# Patient Record
Sex: Male | Born: 1995 | Race: Black or African American | Hispanic: No | Marital: Single | Smoking: Current every day smoker
Health system: Southern US, Community
[De-identification: ages and names within clinical notes are randomized; demographics above are authoritative.]

## PROBLEM LIST (undated history)

## (undated) DIAGNOSIS — A539 Syphilis, unspecified: Secondary | ICD-10-CM

## (undated) DIAGNOSIS — A549 Gonococcal infection, unspecified: Secondary | ICD-10-CM

## (undated) HISTORY — PX: NECK SURGERY: SHX720

---
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
## 2013-01-11 ENCOUNTER — Encounter: Payer: Self-pay | Admitting: Emergency Medicine

## 2013-01-11 ENCOUNTER — Ambulatory Visit (INDEPENDENT_AMBULATORY_CARE_PROVIDER_SITE_OTHER): Payer: Medicaid Other | Admitting: Emergency Medicine

## 2013-01-11 ENCOUNTER — Other Ambulatory Visit (HOSPITAL_COMMUNITY)
Admission: RE | Admit: 2013-01-11 | Discharge: 2013-01-11 | Disposition: A | Discharge status: Home / Self Care | Payer: Medicaid Other | Source: Ambulatory Visit | Attending: Family Medicine | Admitting: Family Medicine

## 2013-01-11 VITALS — BP 130/79 | HR 70 | Ht 65.0 in | Wt 126.0 lb

## 2013-01-11 DIAGNOSIS — Z00129 Encounter for routine child health examination without abnormal findings: Secondary | ICD-10-CM

## 2013-01-11 DIAGNOSIS — Z113 Encounter for screening for infections with a predominantly sexual mode of transmission: Secondary | ICD-10-CM

## 2013-01-11 DIAGNOSIS — Z23 Encounter for immunization: Secondary | ICD-10-CM

## 2013-01-11 NOTE — Patient Instructions (Addendum)
It was nice to meet you! It sounds like everything is going well. We are going to start the HPV vaccine today. You will need to come back in April and August to complete the series. I recommend that you get tested for gonorrhea and chlamydia once a year.  You can to this here or at the health department. Follow up in 1 year or sooner as needed.

## 2013-01-11 NOTE — Progress Notes (Signed)
  Subjective:     History was provided by the patient and girlfriend.  Jeremiah Christian is a 17 y.o. male who is here for this wellness visit.   Current Issues: Current concerns include:None  H (Home) Family Relationships: good and lives with grandparents Communication: good with grandparents Responsibilities: has responsibilities at home  E (Education): Grades: okay School: good attendance Future Plans: community college; wants to be Surveyor, minerals  A (Activities) Sports: no sports Exercise: No Activities: > 2 hrs TV/computer Friends: Yes  and activities mostly consist of smoking and drinking with friends  Drugs Tobacco: Yes - anywhere from 1 cigarette to 1/2pack per day Alcohol: Yes - drinks our of liquor bottle 2x per week Drugs: Yes - marijuana  Sex Activity: sexually active and no condom use  Suicide Risk Emotions: healthy Depression: denies feelings of depression Suicidal: denies suicidal ideation     Objective:     Filed Vitals:   01/11/13 1517  BP: 130/79  Pulse: 70  Height: 5\' 5"  (1.651 m)  Weight: 126 lb (57.153 kg)   Growth parameters are noted and are appropriate for age.  General:   alert, cooperative, appears stated age and no distress  Gait:   normal  Skin:   normal  Oral cavity:   lips, mucosa, and tongue normal; teeth and gums normal  Eyes:   sclerae white, pupils equal and reactive, red reflex normal bilaterally  Ears:   normal bilaterally  Neck:   normal, supple  Lungs:  clear to auscultation bilaterally  Heart:   regular rate and rhythm, S1, S2 normal, no murmur, click, rub or gallop  Abdomen:  soft, non-tender; bowel sounds normal; no masses,  no organomegaly  GU:  not examined  Extremities:   extremities normal, atraumatic, no cyanosis or edema  Neuro:  normal without focal findings, mental status, speech normal, alert and oriented x3, PERLA, muscle tone and strength normal and symmetric, reflexes normal and symmetric and gait  and station normal     Assessment:    Healthy 17 y.o. male child.    Plan:   1. Anticipatory guidance discussed. Physical activity, Safety and extensive discussion regarding drug/alcohol use and sexual activity -GC/Chlamydia today for routine screening -start HPV series today  2. Follow-up visit in 12 months for next wellness visit, or sooner as needed.

## 2013-01-15 ENCOUNTER — Encounter: Payer: Self-pay | Admitting: Emergency Medicine

## 2013-08-25 ENCOUNTER — Encounter (HOSPITAL_COMMUNITY): Payer: Self-pay | Admitting: Emergency Medicine

## 2013-08-25 ENCOUNTER — Emergency Department (HOSPITAL_COMMUNITY): Payer: Medicaid Other

## 2013-08-25 ENCOUNTER — Emergency Department (HOSPITAL_COMMUNITY)
Admission: EM | Admit: 2013-08-25 | Discharge: 2013-08-25 | Disposition: A | Discharge status: Home / Self Care | Payer: Medicaid Other | Attending: Emergency Medicine | Admitting: Emergency Medicine

## 2013-08-25 DIAGNOSIS — Y929 Unspecified place or not applicable: Secondary | ICD-10-CM | POA: Insufficient documentation

## 2013-08-25 DIAGNOSIS — S5012XA Contusion of left forearm, initial encounter: Secondary | ICD-10-CM

## 2013-08-25 DIAGNOSIS — W108XXA Fall (on) (from) other stairs and steps, initial encounter: Secondary | ICD-10-CM | POA: Insufficient documentation

## 2013-08-25 DIAGNOSIS — S5010XA Contusion of unspecified forearm, initial encounter: Secondary | ICD-10-CM | POA: Insufficient documentation

## 2013-08-25 DIAGNOSIS — Y9389 Activity, other specified: Secondary | ICD-10-CM | POA: Insufficient documentation

## 2013-08-25 DIAGNOSIS — F17200 Nicotine dependence, unspecified, uncomplicated: Secondary | ICD-10-CM | POA: Insufficient documentation

## 2013-08-25 MED ORDER — IBUPROFEN 600 MG PO TABS: 600.0000 mg | ORAL_TABLET | Freq: Four times a day (QID) | ORAL | Status: DC | PRN

## 2013-08-25 MED ORDER — IBUPROFEN 400 MG PO TABS
600.0000 mg | ORAL_TABLET | Freq: Once | ORAL | Status: AC
  Administered 2013-08-25: 600 mg via ORAL
  Filled 2013-08-25 (×2): qty 1

## 2013-08-25 NOTE — ED Notes (Signed)
Pt reports slip and fall down 3 steps Thursday landing on left arm. Pt has not taken any medication for pain today. Pt able to move extremity.

## 2013-08-25 NOTE — ED Provider Notes (Signed)
CSN: [id number]     Arrival date & time 08/25/13  1745 History  This chart was scribed for non-physician practitioner, Rhea Bleacher, PA-C working with Flint Melter, MD by Greggory Stallion, ED scribe. This patient was seen in room TR09C/TR09C and the patient's care was started at 6:43 PM.   Chief Complaint  Patient presents with  . Fall  . Arm Pain    Left arm   The history is provided by the patient. No language interpreter was used.   HPI Comments: Jeremiah Christian is a 17 y.o. male who presents to the Emergency Department complaining of a fall that occurred 2 days ago. He states he fell down 3 steps and landed on his left arm. Pt has sudden onset left arm pain. Certain movements worsen the pain. Pt denies shoulder pain and elbow pain. He has not taken any medications for the pain.   Past Medical History  Diagnosis Date  . Preterm infant     born 2 months early   Past Surgical History  Procedure Laterality Date  . Neck surgery     Family History  Problem Relation Age of Onset  . Kidney failure Mother     ?genetic   History  Substance Use Topics  . Smoking status: Current Every Day Smoker -- 0.20 packs/day    Types: Cigarettes  . Smokeless tobacco: Never Used  . Alcohol Use: No     Comment: drinks liqour from bottle 2x per week "denies ETOH use"    Review of Systems  Constitutional: Negative for activity change.  Musculoskeletal: Positive for myalgias. Negative for arthralgias, back pain, gait problem, joint swelling and neck pain.  Skin: Negative for wound.  Neurological: Negative for weakness and numbness.   Allergies  Banana  Home Medications  No current outpatient prescriptions on file.  BP 139/72  Pulse 76  Temp(Src) 98.3 F (36.8 C) (Oral)  Resp 18  Wt 130 lb 14.4 oz (59.376 kg)  BMI 21.78 kg/m2  SpO2 98%  Physical Exam  Nursing note and vitals reviewed. Constitutional: He appears well-developed and well-nourished.  HENT:  Head: Normocephalic and  atraumatic.  Eyes: Conjunctivae are normal.  Neck: Normal range of motion. Neck supple.  Cardiovascular: Normal pulses.   Musculoskeletal: He exhibits tenderness. He exhibits no edema.       Left elbow: Normal. He exhibits normal range of motion, no swelling and no effusion. No tenderness found.       Left wrist: Normal. He exhibits normal range of motion, no tenderness and no bony tenderness.       Left forearm: He exhibits tenderness and bony tenderness. He exhibits no swelling and no edema.       Arms:      Left hand: Normal. Normal sensation noted. Normal strength noted.  Neurological: He is alert. No sensory deficit.  Motor, sensation, and vascular distal to the injury is fully intact.   Skin: Skin is warm and dry.  Psychiatric: He has a normal mood and affect.    ED Course  Procedures (including critical care time)  DIAGNOSTIC STUDIES: Oxygen Saturation is 98% on RA, normal by my interpretation.    COORDINATION OF CARE: 6:46 PM-Discussed treatment plan which includes an arm sling and pain medication with pt at bedside and pt agreed to plan. Advised pt to follow up with orthopedics if pain does not resolve in about one week.   Labs Review Labs Reviewed - No data to display Imaging Review Dg Forearm  Left  08/25/2013   CLINICAL DATA:  Fall with left forearm injury and pain.  EXAM: LEFT FOREARM - 2 VIEW  COMPARISON:  None.  FINDINGS: No evidence of acute fracture,subluxation or dislocation identified.  No radio-opaque foreign bodies are present.  No focal bony lesions are noted.  The joint spaces are unremarkable.  IMPRESSION: Unremarkable left forearm.   Electronically Signed   By: Laveda Abbe M.D.   On: 08/25/2013 18:36    EKG Interpretation   None      Vital signs reviewed and are as follows: Filed Vitals:   08/25/13 1849  BP: 118/61  Pulse: 61  Temp:   Resp: 18   Patient informed of x-ray results. Sling by nurse tech. Patient was counseled on RICE protocol and told to  rest injury, use ice for no longer than 15 minutes every hour, compress the area, and elevate above the level of their heart as much as possible to reduce swelling.  Questions answered.  Patient verbalized understanding.    Orthopedic followup given if not improved in one week.  Patient counseled on NSAIDs.   MDM   1. Forearm contusion, left, initial encounter    Forearm injury, x-rays negative. Patient with full range of motion in all joints with some pain. Do not suspect neurovascular compromise. Compartments a forearm are soft.  Conservative management indicated with orthopedic followup if not improving.   I personally performed the services described in this documentation, which was scribed in my presence. The recorded information has been reviewed and is accurate.   Renne Crigler, PA-C 08/25/13 [phone]

## 2013-08-26 NOTE — ED Provider Notes (Signed)
Medical screening examination/treatment/procedure(s) were performed by non-physician practitioner and as supervising physician I was immediately available for consultation/collaboration.  Ralynn San L Jaliyah Fotheringham, MD 08/26/13 0036 

## 2013-09-27 ENCOUNTER — Encounter (HOSPITAL_COMMUNITY): Payer: Self-pay | Admitting: Emergency Medicine

## 2013-09-27 ENCOUNTER — Emergency Department (HOSPITAL_COMMUNITY)
Admission: EM | Admit: 2013-09-27 | Discharge: 2013-09-27 | Disposition: A | Discharge status: Home / Self Care | Payer: Medicaid Other | Attending: Emergency Medicine | Admitting: Emergency Medicine

## 2013-09-27 ENCOUNTER — Emergency Department (HOSPITAL_COMMUNITY)
Admission: EM | Admit: 2013-09-27 | Discharge: 2013-09-27 | Disposition: A | Discharge status: Home / Self Care | Payer: Medicaid Other | Source: Home / Self Care | Attending: Emergency Medicine | Admitting: Emergency Medicine

## 2013-09-27 DIAGNOSIS — F17200 Nicotine dependence, unspecified, uncomplicated: Secondary | ICD-10-CM | POA: Insufficient documentation

## 2013-09-27 DIAGNOSIS — M25539 Pain in unspecified wrist: Secondary | ICD-10-CM | POA: Insufficient documentation

## 2013-09-27 DIAGNOSIS — Y929 Unspecified place or not applicable: Secondary | ICD-10-CM | POA: Insufficient documentation

## 2013-09-27 DIAGNOSIS — M79602 Pain in left arm: Secondary | ICD-10-CM

## 2013-09-27 DIAGNOSIS — Y939 Activity, unspecified: Secondary | ICD-10-CM | POA: Insufficient documentation

## 2013-09-27 DIAGNOSIS — S41112A Laceration without foreign body of left upper arm, initial encounter: Secondary | ICD-10-CM

## 2013-09-27 DIAGNOSIS — R209 Unspecified disturbances of skin sensation: Secondary | ICD-10-CM | POA: Insufficient documentation

## 2013-09-27 DIAGNOSIS — Z792 Long term (current) use of antibiotics: Secondary | ICD-10-CM | POA: Insufficient documentation

## 2013-09-27 DIAGNOSIS — S51809A Unspecified open wound of unspecified forearm, initial encounter: Secondary | ICD-10-CM | POA: Insufficient documentation

## 2013-09-27 DIAGNOSIS — W260XXA Contact with knife, initial encounter: Secondary | ICD-10-CM | POA: Insufficient documentation

## 2013-09-27 DIAGNOSIS — G8911 Acute pain due to trauma: Secondary | ICD-10-CM | POA: Insufficient documentation

## 2013-09-27 DIAGNOSIS — S41112D Laceration without foreign body of left upper arm, subsequent encounter: Secondary | ICD-10-CM

## 2013-09-27 DIAGNOSIS — Y999 Unspecified external cause status: Secondary | ICD-10-CM | POA: Insufficient documentation

## 2013-09-27 MED ORDER — OXYCODONE-ACETAMINOPHEN 5-325 MG PO TABS
2.0000 | ORAL_TABLET | Freq: Once | ORAL | Status: AC
  Administered 2013-09-27: 2 via ORAL
  Filled 2013-09-27: qty 2

## 2013-09-27 MED ORDER — CEPHALEXIN 500 MG PO CAPS: 500.0000 mg | ORAL_CAPSULE | Freq: Three times a day (TID) | ORAL | Status: DC

## 2013-09-27 MED ORDER — IBUPROFEN 800 MG PO TABS
800.0000 mg | ORAL_TABLET | Freq: Once | ORAL | Status: AC
  Administered 2013-09-27: 800 mg via ORAL
  Filled 2013-09-27: qty 1

## 2013-09-27 MED ORDER — STERILE WATER FOR INJECTION IJ SOLN
INTRAMUSCULAR | Status: AC
  Administered 2013-09-27: 2.5 mL
  Filled 2013-09-27: qty 10

## 2013-09-27 MED ORDER — TETANUS-DIPHTH-ACELL PERTUSSIS 5-2.5-18.5 LF-MCG/0.5 IM SUSP
0.5000 mL | Freq: Once | INTRAMUSCULAR | Status: AC
  Administered 2013-09-27: 0.5 mL via INTRAMUSCULAR
  Filled 2013-09-27: qty 0.5

## 2013-09-27 MED ORDER — CEFAZOLIN SODIUM 1 G IJ SOLR
1.0000 g | Freq: Once | INTRAMUSCULAR | Status: AC
  Administered 2013-09-27: 1 g via INTRAMUSCULAR
  Filled 2013-09-27: qty 10

## 2013-09-27 MED ORDER — OXYCODONE-ACETAMINOPHEN 5-325 MG PO TABS: 1.0000 | ORAL_TABLET | ORAL | Status: DC | PRN
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
## 2013-09-27 NOTE — ED Notes (Signed)
Zavitz MD at bedside. 
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
## 2013-09-27 NOTE — ED Notes (Signed)
Pt has a ride home.  

## 2013-09-27 NOTE — ED Notes (Signed)
Removal of tourniquet left hand loss dusky color and return WNL for race. Cap refill less than 2 seconds. Pulse + 2 left radial

## 2013-09-27 NOTE — ED Notes (Signed)
Wound dressed with bacitracin, gauze and kerlix wrap. Pt instructed on further wound care.

## 2013-09-28 NOTE — ED Provider Notes (Signed)
  Medical screening examination/treatment/procedure(s) were performed by non-physician practitioner and as supervising physician I was immediately available for consultation/collaboration.  EKG Interpretation   None          Rosealynn Mateus, MD 09/28/13 2329 

## 2013-10-02 ENCOUNTER — Emergency Department (HOSPITAL_COMMUNITY)
Admission: EM | Admit: 2013-10-02 | Discharge: 2013-10-02 | Disposition: A | Discharge status: Home / Self Care | Payer: Medicaid Other | Attending: Emergency Medicine | Admitting: Emergency Medicine

## 2013-10-02 ENCOUNTER — Encounter (HOSPITAL_COMMUNITY): Payer: Self-pay | Admitting: Emergency Medicine

## 2013-10-02 DIAGNOSIS — Z792 Long term (current) use of antibiotics: Secondary | ICD-10-CM | POA: Insufficient documentation

## 2013-10-02 DIAGNOSIS — Z4801 Encounter for change or removal of surgical wound dressing: Secondary | ICD-10-CM | POA: Insufficient documentation

## 2013-10-02 DIAGNOSIS — F17200 Nicotine dependence, unspecified, uncomplicated: Secondary | ICD-10-CM | POA: Insufficient documentation

## 2013-10-02 DIAGNOSIS — Z5189 Encounter for other specified aftercare: Secondary | ICD-10-CM

## 2013-10-02 MED ORDER — BACITRACIN ZINC 500 UNIT/GM EX OINT
1.0000 "application " | TOPICAL_OINTMENT | Freq: Two times a day (BID) | CUTANEOUS | Status: DC
  Administered 2013-10-02: 1 via TOPICAL

## 2013-10-02 MED ORDER — IBUPROFEN 600 MG PO TABS: 600.0000 mg | ORAL_TABLET | Freq: Four times a day (QID) | ORAL | Status: DC | PRN
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
## 2013-10-02 NOTE — ED Notes (Signed)
Pt had sutures to his left arm last week; pain at that location.
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
## 2013-10-03 NOTE — ED Provider Notes (Signed)
Medical screening examination/treatment/procedure(s) were performed by non-physician practitioner and as supervising physician I was immediately available for consultation/collaboration.  EKG Interpretation   None         Florence Yeung, MD 10/03/13 1559 

## 2013-10-05 ENCOUNTER — Encounter: Payer: Self-pay | Admitting: Family Medicine

## 2013-10-05 ENCOUNTER — Ambulatory Visit (INDEPENDENT_AMBULATORY_CARE_PROVIDER_SITE_OTHER): Payer: Medicaid Other | Admitting: Family Medicine

## 2013-10-05 VITALS — BP 123/60 | HR 62 | Temp 98.2°F | Ht 65.0 in | Wt 125.0 lb

## 2013-10-05 DIAGNOSIS — Z23 Encounter for immunization: Secondary | ICD-10-CM

## 2013-10-05 DIAGNOSIS — Z5189 Encounter for other specified aftercare: Secondary | ICD-10-CM

## 2013-10-05 DIAGNOSIS — S41112A Laceration without foreign body of left upper arm, initial encounter: Secondary | ICD-10-CM | POA: Insufficient documentation

## 2013-10-05 DIAGNOSIS — S41102D Unspecified open wound of left upper arm, subsequent encounter: Secondary | ICD-10-CM

## 2013-10-05 DIAGNOSIS — S41109A Unspecified open wound of unspecified upper arm, initial encounter: Secondary | ICD-10-CM

## 2013-10-05 NOTE — Assessment & Plan Note (Signed)
Healing well, no signs of infection and no systemic symptoms. Orthopedic referral placed. Instructed patient to come to our clinic Monday to retrieve stitches.
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
## 2013-10-05 NOTE — Patient Instructions (Signed)
Your wound seems to be healing well. Continue current care and you can make an appointment on Monday to retrieve the stitches. I have placed a referral for orthopedic and you will be called with the details of that appointment. It is important that you don't miss it. Please followup with your primary doctor. If at any given point to develop a increase in swelling, redness, pain, fever, chills, or other concerning symptoms you need to be evaluated right away.

## 2013-10-08 ENCOUNTER — Ambulatory Visit (INDEPENDENT_AMBULATORY_CARE_PROVIDER_SITE_OTHER): Payer: Medicaid Other | Admitting: Family Medicine

## 2013-10-08 ENCOUNTER — Encounter: Payer: Self-pay | Admitting: Family Medicine

## 2013-10-08 VITALS — BP 114/49 | HR 61 | Temp 98.7°F | Resp 18 | Wt 125.0 lb

## 2013-10-08 DIAGNOSIS — S41109A Unspecified open wound of unspecified upper arm, initial encounter: Secondary | ICD-10-CM

## 2013-10-08 DIAGNOSIS — S41112A Laceration without foreign body of left upper arm, initial encounter: Secondary | ICD-10-CM

## 2013-10-08 NOTE — Assessment & Plan Note (Addendum)
Healing well no drainage or signs of infection. We discussed with attending Dr. Lum Babe and decided to retrieve only 3 stiches. Pt will come back in another week to retrieve the rest. Also instructed to keep compressive dressing in order to help with healing.  Discussed signs of worsening condition that should prompt re-evaluation.

## 2013-10-08 NOTE — Patient Instructions (Signed)
He your arm with compression dressing and come back in one week to retrieve the rest of your stitches.

## 2013-10-08 NOTE — Progress Notes (Signed)
Family Medicine Office Visit Note   Subjective:   Patient ID: Jeremiah Christian, male  DOB: May 08, 1996, 17 y.o.. MRN: [id number]   Pt that comes today for suture removal. He denies fever, malaise or pain. He has been trying to exercise his left arm, denies pain but reports pulling sensation. Pt is awaiting for Ortho evaluation.   Review of Systems:  Per HPI  Objective:   Physical Exam: Gen: NAD  HEENT: Moist mucous membranes  CV: Regular rate and rhythm, no murmurs  PULM: Clear to auscultation bilaterally.  EXT: left arm with linear wound and stitches on. No erythema or marked edema on affected area or surrounding tissues. Good hand movement and pulses intact. Good capillary refill. No drainage.  Assessment & Plan:

## 2013-10-08 NOTE — Progress Notes (Signed)
Pt is here for a f/u and to have stitches removed from left forearm Voices no new concerns.

## 2013-10-15 ENCOUNTER — Ambulatory Visit (INDEPENDENT_AMBULATORY_CARE_PROVIDER_SITE_OTHER): Payer: Medicaid Other | Admitting: Family Medicine

## 2013-10-15 ENCOUNTER — Ambulatory Visit: Payer: Self-pay | Admitting: Emergency Medicine

## 2013-10-15 ENCOUNTER — Encounter: Payer: Self-pay | Admitting: Family Medicine

## 2013-10-15 VITALS — BP 133/89 | HR 70 | Temp 98.0°F | Wt 130.4 lb

## 2013-10-15 DIAGNOSIS — S41109A Unspecified open wound of unspecified upper arm, initial encounter: Secondary | ICD-10-CM

## 2013-10-15 DIAGNOSIS — S41112A Laceration without foreign body of left upper arm, initial encounter: Secondary | ICD-10-CM

## 2013-10-15 NOTE — Patient Instructions (Addendum)
Continue with slow exercises that can help to regain complete ROM. No lifting more than 5 Lb with injured arm. Important to f/u with Ortho tomorrow. F/u with [place] as scheduled.

## 2013-10-15 NOTE — Assessment & Plan Note (Addendum)
Well healed. No drainage. Rest of stitches were removed today. Ortho appointment is scheduled for 10/16/13.

## 2013-10-15 NOTE — Progress Notes (Signed)
Family Medicine Office Visit Note   Subjective:   Patient ID: Jeremiah Christian, male  DOB: 1996-02-24, 17 y.o.. MRN: [id number]   Pt that comes today for suture removal. He denies fever, malaise or pain. He has been trying to exercise his left arm and has gotten more ROM per pt's report. Denies pain, swelling or redness. Pt is awaiting for Ortho evaluation and has appointment for tomorrow at [facility] and [place] office.    Review of Systems:  Per HPI  Objective:   Physical Exam: Gen: NAD  HEENT: Moist mucous membranes  CV: Regular rate and rhythm, no murmurs  PULM: Clear to auscultation bilaterally.  EXT: left arm with linear wound and some stitches on. No erythema or marked edema on affected area or surrounding tissues. Good hand movement and pulses intact. Good capillary refill. No drainage.  Assessment & Plan:

## 2014-02-20 DIAGNOSIS — L723 Sebaceous cyst: Secondary | ICD-10-CM | POA: Insufficient documentation

## 2014-02-20 DIAGNOSIS — F17200 Nicotine dependence, unspecified, uncomplicated: Secondary | ICD-10-CM | POA: Insufficient documentation

## 2014-02-20 DIAGNOSIS — Z202 Contact with and (suspected) exposure to infections with a predominantly sexual mode of transmission: Secondary | ICD-10-CM | POA: Insufficient documentation

## 2014-02-21 ENCOUNTER — Emergency Department (HOSPITAL_COMMUNITY)
Admission: EM | Admit: 2014-02-21 | Discharge: 2014-02-21 | Disposition: A | Discharge status: Home / Self Care | Payer: Medicaid Other | Attending: Emergency Medicine | Admitting: Emergency Medicine

## 2014-02-21 ENCOUNTER — Encounter (HOSPITAL_COMMUNITY): Payer: Self-pay | Admitting: Emergency Medicine

## 2014-02-21 DIAGNOSIS — Z202 Contact with and (suspected) exposure to infections with a predominantly sexual mode of transmission: Secondary | ICD-10-CM

## 2014-02-21 DIAGNOSIS — L729 Follicular cyst of the skin and subcutaneous tissue, unspecified: Secondary | ICD-10-CM

## 2014-02-21 LAB — RPR

## 2014-02-21 LAB — HIV ANTIBODY (ROUTINE TESTING W REFLEX): HIV 1&2 Ab, 4th Generation: NONREACTIVE

## 2014-02-21 NOTE — ED Notes (Signed)
Pt states that he has bump on his R groin area that he noticed yesterday. Pt denies any drainage, pain or itching. Pt alert and ambulatory to triage.

## 2014-02-21 NOTE — Discharge Instructions (Signed)
You were seen and evaluated for your concerns about bumps on your scrotum and groin area as well as concerns for possible STD. Your testing will results in the next one to 2 days. It is recommended that you do not have any sexual intercourse until your results come back. Please inform all sexual partners of any positive results and followup for any needed treatment. Please followup with your primary care provider for the bumps on your skin. Return if you have any changing or worsening symptoms.    Epidermal Cyst An epidermal cyst is usually a small, painless lump under the skin. Cysts often occur on the face, neck, stomach, chest, or genitals. The cyst may be filled with a bad smelling paste. Do not pop your cyst. Popping the cyst can cause pain and puffiness (swelling). HOME CARE   Only take medicines as told by your doctor.  Take your medicine (antibiotics) as told. Finish it even if you start to feel better. GET HELP RIGHT AWAY IF:  Your cyst is tender, red, or puffy.  You are not getting better, or you are getting worse.  You have any questions or concerns. MAKE SURE YOU:  Understand these instructions.  Will watch your condition.  Will get help right away if you are not doing well or get worse. Document Released: 12/09/2004 Document Revised: 05/02/2012 Document Reviewed: 05/10/2011 [facility] Patient Information 2014 [place], [place].    Sexually Transmitted Disease A sexually transmitted disease (STD) is a disease or infection that may be passed (transmitted) from person to person, usually during sexual activity. This may happen by way of saliva, semen, blood, vaginal mucus, or urine. Common STDs include:   Gonorrhea.   Chlamydia.   Syphilis.   HIV and AIDS.   Genital herpes.   Hepatitis B and C.   Trichomonas.   Human papillomavirus (HPV).   Pubic lice.   Scabies.  Mites.  Bacterial vaginosis. WHAT ARE CAUSES OF STDs? An STD may be caused by  bacteria, a virus, or parasites. STDs are often transmitted during sexual activity if one person is infected. However, they may also be transmitted through nonsexual means. STDs may be transmitted after:   Sexual intercourse with an infected person.   Sharing sex toys with an infected person.   Sharing needles with an infected person or using unclean piercing or tattoo needles.  Having intimate contact with the genitals, mouth, or rectal areas of an infected person.   Exposure to infected fluids during birth. WHAT ARE THE SIGNS AND SYMPTOMS OF STDs? Different STDs have different symptoms. Some people may not have any symptoms. If symptoms are present, they may include:   Painful or bloody urination.   Pain in the pelvis, abdomen, vagina, anus, throat, or eyes.   Skin rash, itching, irritation, growths, sores (lesions), ulcerations, or warts in the genital or anal area.  Abnormal vaginal discharge with or without bad odor.   Penile discharge in men.   Fever.   Pain or bleeding during sexual intercourse.   Swollen glands in the groin area.   Yellow skin and eyes (jaundice). This is seen with hepatitis.   Swollen testicles.  Infertility.  Sores and blisters in the mouth. HOW ARE STDs DIAGNOSED? To make a diagnosis, your health care provider may:   Take a medical history.   Perform a physical exam.   Take a sample of any discharge for examination.  Swab the throat, cervix, opening to the penis, rectum, or vagina for testing.  Test  a sample of your first morning urine.   Perform blood tests.   Perform a Pap smear, if this applies.   Perform a colposcopy.   Perform a laparoscopy.  HOW ARE STDs TREATED? Treatment depends on the STD. Some STDs may be treated but not cured.   Chlamydia, gonorrhea, trichomonas, and syphilis can be cured with antibiotics.   Genital herpes, hepatitis, and HIV can be treated, but not cured, with prescribed  medicines. The medicines lessen symptoms.   Genital warts from HPV can be treated with medicine or by freezing, burning (electrocautery), or surgery. Warts may come back.   HPV cannot be cured with medicine or surgery. However, abnormal areas may be removed from the cervix, vagina, or vulva.   If your diagnosis is confirmed, your recent sexual partners need treatment. This is true even if they are symptom-free or have a negative culture or evaluation. They should not have sex until their health care providers say it is OK. HOW CAN I REDUCE MY RISK OF GETTING AN STD?  Use latex condoms, dental dams, and water-soluble lubricants during sexual activity. Do not use petroleum jelly or oils.  Get vaccinated for HPV and hepatitis. If you have not received these vaccines in the past, talk to your health care provider about whether one or both might be right for you.   Avoid risky sex practices that can break the skin.  WHAT SHOULD I DO IF I THINK I HAVE AN STD?  See your health care provider.   Inform all sexual partners. They should be tested and treated for any STDs.  Do not have sex until your health care provider says it is OK. WHEN SHOULD I GET HELP? Seek immediate medical care if:  You develop severe abdominal pain.  You are a man and notice swelling or pain in the testicles.  You are a woman and notice swelling or pain in your vagina. Document Released: 01/22/2003 Document Revised: 08/22/2013 Document Reviewed: 05/22/2013 [facility] Patient Information 2014 [place], [place].

## 2014-02-21 NOTE — ED Provider Notes (Signed)
Medical screening examination/treatment/procedure(s) were performed by non-physician practitioner and as supervising physician I was immediately available for consultation/collaboration.   EKG Interpretation None       Latacha Texeira M Concha Sudol, MD 02/21/14 0753 

## 2014-02-21 NOTE — ED Provider Notes (Signed)
CSN: [id number]     Arrival date & time 02/20/14  2141 History   First MD Initiated Contact with Patient 02/21/14 0106     Chief Complaint  Patient presents with  . Groin Pain   HPI  History provided by the patient. Patient is a 18 year old male with no significant PMH presented with concerns for small bump on the right scrotum and groin area. Patient states that he had noticed that all yesterday. He denies any significant pain or irritation of the skin. He does report very mild tenderness if he squeezes hard. He does not believe he has had similar symptoms previously. Patient is sexually active occasionally without protection. He denies any dysuria, hematuria urinary frequency. No penile discharge. No testicular pain or swelling. He denies any redness of the skin. No other rashes or lesions. He has not used any treatment for the symptoms. No other aggravating or alleviating factors. No other associated symptoms.   Past Medical History  Diagnosis Date  . Preterm infant     born 2 months early   Past Surgical History  Procedure Laterality Date  . Neck surgery     Family History  Problem Relation Age of Onset  . Kidney failure Mother     ?genetic   History  Substance Use Topics  . Smoking status: Current Every Day Smoker -- 0.20 packs/day    Types: Cigarettes  . Smokeless tobacco: Never Used  . Alcohol Use: No     Comment: drinks liqour from bottle 2x per week "denies ETOH use"    Review of Systems  Constitutional: Negative for fever and chills.  Genitourinary: Negative for dysuria, frequency, hematuria, discharge, penile swelling, scrotal swelling, penile pain and testicular pain.  Skin: Negative for rash.  All other systems reviewed and are negative.     Allergies  Banana  Home Medications  No current outpatient prescriptions on file. BP 117/70  Pulse 67  Temp(Src) 98.2 F (36.8 C) (Oral)  Resp 20  SpO2 100% Physical Exam  Nursing note and vitals  reviewed. Constitutional: He is oriented to person, place, and time. He appears well-developed and well-nourished. No distress.  HENT:  Head: Normocephalic.  Cardiovascular: Normal rate and regular rhythm.   Pulmonary/Chest: Effort normal and breath sounds normal. No respiratory distress. He has no wheezes.  Abdominal: Soft.  Genitourinary: Testes normal and penis normal. Circumcised. No discharge found.  Small superficial 5 mm nodule to the right inferior scrotal area. Overlying skin normal without erythema. No significant tenderness. No fluctuance. Similar yet slightly smaller lesion to the opposite left scrotal area.  Testicles normal without mass or lesion. No tenderness. No pain or tenderness along the epididymis. Skin normal without any rashes or lesions. No penile discharge. No significant lymphadenopathy  Lymphadenopathy:       Right: No inguinal adenopathy present.       Left: No inguinal adenopathy present.  Neurological: He is alert and oriented to person, place, and time.  Skin: Skin is warm and dry. No rash noted.  Psychiatric: He has a normal mood and affect.    ED Course  Procedures   COORDINATION OF CARE:  Nursing notes reviewed. Vital signs reviewed. Initial pt interview and examination performed.   Filed Vitals:   02/20/14 2220  BP: 117/70  Pulse: 67  Temp: 98.2 F (36.8 C)  TempSrc: Oral  Resp: 20  SpO2: 100%    1:23 AM-patient seen and evaluated. He appears well no acute distress. Small nodules to the scrotal  wall patient only had noticed on the right side there is similar nodule to the left side. These do not appear overly concerning for abscess or infection. No other signs concerning for STD. Patient is requesting STD check. At this time I will recommend he followup with his PCP and monitor the areas for changes. Strict return precautions given.   MDM   Final diagnoses:  Scrotal cyst  Possible exposure to STD         Angus SellerPeter S Kynisha Memon,  PA-C 02/21/14 0202

## 2014-02-22 LAB — GC/CHLAMYDIA PROBE AMP
CT Probe RNA: NEGATIVE
GC Probe RNA: NEGATIVE

## 2014-08-06 ENCOUNTER — Emergency Department (HOSPITAL_COMMUNITY)
Admission: EM | Admit: 2014-08-06 | Discharge: 2014-08-06 | Disposition: A | Discharge status: Home / Self Care | Payer: Medicaid Other | Attending: Emergency Medicine | Admitting: Emergency Medicine

## 2014-08-06 ENCOUNTER — Encounter (HOSPITAL_COMMUNITY): Payer: Self-pay | Admitting: Emergency Medicine

## 2014-08-06 DIAGNOSIS — J111 Influenza due to unidentified influenza virus with other respiratory manifestations: Secondary | ICD-10-CM | POA: Diagnosis not present

## 2014-08-06 DIAGNOSIS — J029 Acute pharyngitis, unspecified: Secondary | ICD-10-CM | POA: Insufficient documentation

## 2014-08-06 DIAGNOSIS — R197 Diarrhea, unspecified: Secondary | ICD-10-CM | POA: Diagnosis not present

## 2014-08-06 DIAGNOSIS — H5710 Ocular pain, unspecified eye: Secondary | ICD-10-CM | POA: Diagnosis not present

## 2014-08-06 DIAGNOSIS — R6889 Other general symptoms and signs: Secondary | ICD-10-CM

## 2014-08-06 DIAGNOSIS — F17200 Nicotine dependence, unspecified, uncomplicated: Secondary | ICD-10-CM | POA: Diagnosis not present

## 2014-08-06 LAB — RAPID STREP SCREEN (MED CTR MEBANE ONLY): Streptococcus, Group A Screen (Direct): NEGATIVE

## 2014-08-06 MED ORDER — ALBUTEROL SULFATE HFA 108 (90 BASE) MCG/ACT IN AERS
2.0000 | INHALATION_SPRAY | RESPIRATORY_TRACT | Status: DC | PRN
  Administered 2014-08-06: 2 via RESPIRATORY_TRACT
  Filled 2014-08-06: qty 6.7

## 2014-08-06 MED ORDER — GUAIFENESIN-CODEINE 100-10 MG/5ML PO SOLN: 5.0000 mL | Freq: Three times a day (TID) | ORAL | Status: DC | PRN

## 2014-08-06 MED ORDER — ACETAMINOPHEN 500 MG PO TABS
1000.0000 mg | ORAL_TABLET | Freq: Once | ORAL | Status: AC
  Administered 2014-08-06: 1000 mg via ORAL
  Filled 2014-08-06: qty 2

## 2014-08-06 MED ORDER — AMOXICILLIN 500 MG PO CAPS
500.0000 mg | ORAL_CAPSULE | Freq: Once | ORAL | Status: AC
  Administered 2014-08-06: 500 mg via ORAL
  Filled 2014-08-06: qty 1

## 2014-08-06 MED ORDER — AMOXICILLIN 500 MG PO CAPS: 500.0000 mg | ORAL_CAPSULE | Freq: Three times a day (TID) | ORAL | Status: DC

## 2014-08-06 NOTE — Discharge Instructions (Signed)
Upper Respiratory Infection, Adult An upper respiratory infection (URI) is also known as the common cold. It is often caused by a type of germ (virus). Colds are easily spread (contagious). You can pass it to others by kissing, coughing, sneezing, or drinking out of the same glass. Usually, you get better in 1 or 2 weeks.  HOME CARE   Only take medicine as told by your doctor.  Use a warm mist humidifier or breathe in steam from a hot shower.  Drink enough water and fluids to keep your pee (urine) clear or pale yellow.  Get plenty of rest.  Return to work when your temperature is back to normal or as told by your doctor. You may use a face mask and wash your hands to stop your cold from spreading. GET HELP RIGHT AWAY IF:   After the first few days, you feel you are getting worse.  You have questions about your medicine.  You have chills, shortness of breath, or brown or red spit (mucus).  You have yellow or brown snot (nasal discharge) or pain in the face, especially when you bend forward.  You have a fever, puffy (swollen) neck, pain when you swallow, or white spots in the back of your throat.  You have a bad headache, ear pain, sinus pain, or chest pain.  You have a high-pitched whistling sound when you breathe in and out (wheezing).  You have a lasting cough or cough up blood.  You have sore muscles or a stiff neck. MAKE SURE YOU:   Understand these instructions.  Will watch your condition.  Will get help right away if you are not doing well or get worse. Document Released: 04/19/2008 Document Revised: 01/24/2012 Document Reviewed: 02/06/2014 Exit[employer] Patient Information 2015 [employer]. This information is not intended to replace advice given to you by your health care provider. Make sure you discuss any questions you have with your health care provider.  

## 2014-08-06 NOTE — ED Notes (Signed)
Nasal congestion, productive cough - thick mucous (yellow, green), feeling weak, sore throat

## 2014-08-06 NOTE — ED Provider Notes (Signed)
CSN: [id number]     Arrival date & time 08/06/14  1608 History  This chart was scribed for non-physician practitioner, Marlon Pel, PA-C working with Geoffery Lyons, MD by Luisa Dago, ED scribe. This patient was seen in room TR08C/TR08C and the patient's care was started at 5:26 PM.     Chief Complaint  Patient presents with  . Sore Throat  . Nasal Congestion   The history is provided by the patient. No language interpreter was used.   HPI Comments: Jeremiah Christian is a 18 y.o. male who presents to the Emergency Department complaining of a worsening sore throat that started 1 week ago. He is also complaining of a productive cough with thick yellow sputum, fatigue, headache, burning eyes, 1 episode of diarrhea this AM, and chills. Pt states that he's been taking OTC medication to alleviate his symptoms, with minimal relief. Denies emesis, nausea, dysuria, fever, SOB, ear pain, visual disturbances, eye discharge, blood in stool, or SOB.  Past Medical History  Diagnosis Date  . Preterm infant     born 2 months early   Past Surgical History  Procedure Laterality Date  . Neck surgery     Family History  Problem Relation Age of Onset  . Kidney failure Mother     ?genetic   History  Substance Use Topics  . Smoking status: Current Every Day Smoker -- 0.20 packs/day    Types: Cigarettes  . Smokeless tobacco: Never Used  . Alcohol Use: No     Comment: drinks liqour from bottle 2x per week "denies ETOH use"    Review of Systems  Constitutional: Positive for chills and fatigue. Negative for fever.  HENT: Positive for congestion and sore throat. Negative for ear pain, sinus pressure, trouble swallowing and voice change.   Eyes: Positive for pain (burning). Negative for discharge and visual disturbance.  Respiratory: Positive for cough. Negative for shortness of breath.   Cardiovascular: Negative for chest pain.  Gastrointestinal: Positive for diarrhea. Negative for nausea, vomiting  and abdominal pain.  Genitourinary: Negative.  Negative for dysuria.  Neurological: Positive for headaches.  All other systems reviewed and are negative.  Allergies  Banana  Home Medications   Prior to Admission medications   Medication Sig Start Date End Date Taking? Authorizing Provider  amoxicillin (AMOXIL) 500 MG capsule Take 1 capsule (500 mg total) by mouth 3 (three) times daily. 08/06/14   Parys Elenbaas Irine Seal, PA-C  guaiFENesin-codeine 100-10 MG/5ML syrup Take 5-10 mLs by mouth 3 (three) times daily as needed for cough. 08/06/14   Dorthula Matas, PA-C   Triage vitals:BP 133/66  Pulse 82  Temp(Src) 99.5 F (37.5 C) (Oral)  Resp 18  SpO2 96%  Physical Exam  Nursing note and vitals reviewed. Constitutional: He is oriented to person, place, and time. He appears well-developed and well-nourished. No distress.  HENT:  Head: Normocephalic and atraumatic.  Right Ear: Tympanic membrane, external ear and ear canal normal.  Left Ear: Tympanic membrane, external ear and ear canal normal.  Nose: Nose normal. No rhinorrhea. Right sinus exhibits no maxillary sinus tenderness and no frontal sinus tenderness. Left sinus exhibits no maxillary sinus tenderness and no frontal sinus tenderness.  Mouth/Throat: Uvula is midline and mucous membranes are normal. No trismus in the jaw. Normal dentition. No dental abscesses or uvula swelling. Oropharyngeal exudate and posterior oropharyngeal edema present. No posterior oropharyngeal erythema or tonsillar abscesses.  Eyes: Conjunctivae and EOM are normal.  Neck: Trachea normal, normal range of motion  and full passive range of motion without pain. Neck supple. No rigidity. Normal range of motion present. No Brudzinski's sign noted.  Flexion and extension of neck without pain or difficulty. Able to breath without difficulty in extension.  Cardiovascular: Normal rate and regular rhythm.   Pulmonary/Chest: Effort normal. No stridor. No respiratory distress.  He has wheezes.  Abdominal: Soft. There is no tenderness.  No obvious evidence of splenomegaly. Non ttp.   Musculoskeletal: Normal range of motion.  Lymphadenopathy:       Head (right side): No preauricular and no posterior auricular adenopathy present.       Head (left side): No preauricular and no posterior auricular adenopathy present.    He has cervical adenopathy.  Neurological: He is alert and oriented to person, place, and time.  Skin: Skin is warm and dry. No rash noted. He is not diaphoretic.  Psychiatric: He has a normal mood and affect. His behavior is normal.    ED Course  Procedures (including critical care time)  DIAGNOSTIC STUDIES: Oxygen Saturation is 96% on RA, adequate by my interpretation.    COORDINATION OF CARE: 5:30 PM- Will send pt home with an albuterol inhaler. Will also prescribe antibiotics and cough medicine. Advised pt to start his prescribed medication tonight. Pt advised of plan for treatment and pt agrees.  Labs Review Labs Reviewed  RAPID STREP SCREEN  CULTURE, GROUP A STREP    MDM   Final diagnoses:  Flu-like symptoms    Pt had fever of 99.5 on arrival but during stay it increased to 101.3. Given 1 gram of tylenol,  Amoxicillin and an albuterol inhaler for home at [facility]. He appears to not feel well but is non toxic appearing. He is awake and alert and has obvious signs of upper respiratory infection. Given return to ER precautions and a work note. Denies neck pain.   Medications  albuterol (PROVENTIL HFA;VENTOLIN HFA) 108 (90 BASE) MCG/ACT inhaler 2 puff (not administered)  amoxicillin (AMOXIL) capsule 500 mg (not administered)  acetaminophen (TYLENOL) tablet 1,000 mg (not administered)   17 y.o.[name]'s evaluation in the Emergency Department is complete. It has been determined that no acute conditions requiring further emergency intervention are present at this time. The patient/guardian have been advised of the diagnosis and  plan. We have discussed signs and symptoms that warrant return to the ED, such as changes or worsening in symptoms.  Vital signs are stable at discharge. Filed Vitals:   08/06/14 1737  BP: 126/74  Pulse: 88  Temp: 101.3 F (38.5 C)  Resp: 16    Patient/guardian has voiced understanding and agreed to follow-up with the PCP or specialist.   I personally performed the services described in this documentation, which was scribed in my presence. The recorded information has been reviewed and is accurate.    Dorthula Matas, PA-C 08/06/14 1742

## 2014-08-08 NOTE — ED Provider Notes (Signed)
Medical screening examination/treatment/procedure(s) were performed by non-physician practitioner and as supervising physician I was immediately available for consultation/collaboration.     Lillyth Spong, MD 08/08/14 0139 

## 2014-08-09 LAB — CULTURE, GROUP A STREP

## 2014-08-11 ENCOUNTER — Telehealth (HOSPITAL_BASED_OUTPATIENT_CLINIC_OR_DEPARTMENT_OTHER): Payer: Self-pay

## 2014-08-11 NOTE — Telephone Encounter (Signed)
Post ED Visit - Positive Culture Follow-up  Culture report reviewed by antimicrobial stewardship pharmacist:  Wes Dulaney, Pharm.D., BCPS  Celedonio Miyamoto, Pharm.D., BCPS  Georgina Pillion, Pharm.D., BCPS  [place], [address].D., BCPS, AAHIVP  Estella Husk, Pharm.D., BCPS, AAHIVP  Carly Sabat, Pharm.D.  Enzo Bi, [address].D.  Positive Throat culture, Group A Strep Treated with Amoxicillin, organism sensitive to the same and no further patient follow-up is required at this time.  Arvid Right 08/11/2014, 1:02 AM

## 2014-08-13 ENCOUNTER — Telehealth: Payer: Self-pay | Admitting: Family Medicine

## 2014-08-13 NOTE — Telephone Encounter (Signed)
Attempted to call patient on 9/29 to inform him of throat culture results and reiterate importance of finishing total antibiotic course for Strep pharyngitis that he was prescribed from the ED on 9/22.  Left VM for him to call back to the clinic.

## 2015-05-26 ENCOUNTER — Encounter (HOSPITAL_COMMUNITY): Payer: Self-pay | Admitting: *Deleted

## 2015-05-26 ENCOUNTER — Emergency Department (HOSPITAL_COMMUNITY)
Admission: EM | Admit: 2015-05-26 | Discharge: 2015-05-26 | Disposition: A | Discharge status: Home / Self Care | Payer: Medicaid Other | Attending: Emergency Medicine | Admitting: Emergency Medicine

## 2015-05-26 DIAGNOSIS — Z792 Long term (current) use of antibiotics: Secondary | ICD-10-CM | POA: Insufficient documentation

## 2015-05-26 DIAGNOSIS — Z113 Encounter for screening for infections with a predominantly sexual mode of transmission: Secondary | ICD-10-CM | POA: Insufficient documentation

## 2015-05-26 DIAGNOSIS — Z720 Tobacco use: Secondary | ICD-10-CM | POA: Insufficient documentation

## 2015-05-26 DIAGNOSIS — Z202 Contact with and (suspected) exposure to infections with a predominantly sexual mode of transmission: Secondary | ICD-10-CM | POA: Diagnosis present

## 2015-05-26 LAB — HIV ANTIBODY (ROUTINE TESTING W REFLEX): HIV SCREEN 4TH GENERATION: NONREACTIVE

## 2015-05-26 NOTE — ED Notes (Signed)
Pt states that he wants HIV testing and check for STD's; pt states "I just want to make sure I'm good"

## 2015-05-26 NOTE — ED Notes (Signed)
MD at bedside. 

## 2015-05-26 NOTE — ED Provider Notes (Signed)
CSN: [id number]     Arrival date & time 05/26/15  4098 History   First MD Initiated Contact with Patient 05/26/15 [phone]     Chief Complaint  Patient presents with  . Exposure to STD     (Consider location/radiation/quality/duration/timing/severity/associated sxs/prior Treatment) Patient is a 19 y.o. male presenting with STD exposure. The history is provided by the patient.  Exposure to STD Pertinent negatives include no headaches.  Patient requests check for hiv and stds. States he has been having unprotected sex with girlfriend for several weeks, and had recent trip to beach.  Denies known std exposure. States otherwise feels well, asymptomatic. Denies skin or genital ulcers, rashes or lesions. No dysuria or penile discharge. No fever or chills.       Past Medical History  Diagnosis Date  . Preterm infant     born 2 months early   Past Surgical History  Procedure Laterality Date  . Neck surgery     Family History  Problem Relation Age of Onset  . Kidney failure Mother     ?genetic   History  Substance Use Topics  . Smoking status: Current Every Day Smoker -- 0.20 packs/day    Types: Cigarettes  . Smokeless tobacco: Never Used  . Alcohol Use: Yes     Comment: drinks liqour from bottle 2x per week "denies ETOH use"    Review of Systems  Constitutional: Negative for fever and chills.  Genitourinary: Negative for dysuria, scrotal swelling, genital sores and testicular pain.  Musculoskeletal: Negative for myalgias and arthralgias.  Skin: Negative for rash.  Neurological: Negative for headaches.      Allergies  Banana  Home Medications   Prior to Admission medications   Medication Sig Start Date End Date Taking? Authorizing Provider  OVER THE COUNTER MEDICATION 1 Units.   Yes Historical Provider, MD  amoxicillin (AMOXIL) 500 MG capsule Take 1 capsule (500 mg total) by mouth 3 (three) times daily. Patient not taking: Reported on 05/26/2015 08/06/14   Marlon Pel, PA-C  guaiFENesin-codeine 100-10 MG/5ML syrup Take 5-10 mLs by mouth 3 (three) times daily as needed for cough. Patient not taking: Reported on 05/26/2015 08/06/14   Marlon Pel, PA-C   BP 147/77 mmHg  Pulse 86  Temp(Src) 98.6 F (37 C) (Oral)  Resp 19  Ht  (1.626 m)  Wt 135 lb (61.236 kg)  BMI 23.16 kg/m2  SpO2 100% Physical Exam  Constitutional: He is oriented to person, place, and time. He appears well-developed and well-nourished. No distress.  HENT:  Mouth/Throat: Oropharynx is clear and moist.  Eyes: Conjunctivae are normal. No scleral icterus.  Neck: Neck supple. No tracheal deviation present.  Cardiovascular: Normal rate.   Pulmonary/Chest: Effort normal. No accessory muscle usage. No respiratory distress.  Abdominal: Soft. He exhibits no distension. There is no tenderness.  Genitourinary:  Normal external genitalia. No skin lesions. No penile discharge.   Musculoskeletal: Normal range of motion.  Neurological: He is alert and oriented to person, place, and time.  Skin: Skin is warm and dry. No rash noted. He is not diaphoretic.  Psychiatric: He has a normal mood and affect.  Nursing note and vitals reviewed.   ED Course  Procedures (including critical care time) Labs Review     MDM   Labs.   Reviewed nursing notes and prior charts for additional history.   Patient requests hiv and std check. Currently asymptomatic. Labs sent.  Pt informed of time for lab results, will call/return for  results.  Also informed will be notified if positive.     Cathren LaineKevin Krisanne Lich, MD 05/26/15 [phone]

## 2015-05-26 NOTE — Discharge Instructions (Signed)
It was our pleasure to provide your ER care today.  For routine health care, and/or including routine STD checks, it may be more cost effective for you to go directly to a primary care doctor, or the health department.   We will notify you if results are positive.  Follow up with primary care doctor in the next couple weeks.   Return to ER if worse, new symptoms, medical emergency, other concern.    Sexually Transmitted Disease A sexually transmitted disease (STD) is a disease or infection that may be passed (transmitted) from person to person, usually during sexual activity. This may happen by way of saliva, semen, blood, vaginal mucus, or urine. Common STDs include:   Gonorrhea.   Chlamydia.   Syphilis.   HIV and AIDS.   Genital herpes.   Hepatitis B and C.   Trichomonas.   Human papillomavirus (HPV).   Pubic lice.   Scabies.  Mites.  Bacterial vaginosis. WHAT ARE CAUSES OF STDs? An STD may be caused by bacteria, a virus, or parasites. STDs are often transmitted during sexual activity if one person is infected. However, they may also be transmitted through nonsexual means. STDs may be transmitted after:   Sexual intercourse with an infected person.   Sharing sex toys with an infected person.   Sharing needles with an infected person or using unclean piercing or tattoo needles.  Having intimate contact with the genitals, mouth, or rectal areas of an infected person.   Exposure to infected fluids during birth. WHAT ARE THE SIGNS AND SYMPTOMS OF STDs? Different STDs have different symptoms. Some people may not have any symptoms. If symptoms are present, they may include:   Painful or bloody urination.   Pain in the pelvis, abdomen, vagina, anus, throat, or eyes.   A skin rash, itching, or irritation.  Growths, ulcerations, blisters, or sores in the genital and anal areas.  Abnormal vaginal discharge with or without bad odor.   Penile  discharge in men.   Fever.   Pain or bleeding during sexual intercourse.   Swollen glands in the groin area.   Yellow skin and eyes (jaundice). This is seen with hepatitis.   Swollen testicles.  Infertility.  Sores and blisters in the mouth. HOW ARE STDs DIAGNOSED? To make a diagnosis, your health care provider may:   Take a medical history.   Perform a physical exam.   Take a sample of any discharge to examine.  Swab the throat, cervix, opening to the penis, rectum, or vagina for testing.  Test a sample of your first morning urine.   Perform blood tests.   Perform a Pap test, if this applies.   Perform a colposcopy.   Perform a laparoscopy.  HOW ARE STDs TREATED? Treatment depends on the STD. Some STDs may be treated but not cured.   Chlamydia, gonorrhea, trichomonas, and syphilis can be cured with antibiotic medicine.   Genital herpes, hepatitis, and HIV can be treated, but not cured, with prescribed medicines. The medicines lessen symptoms.   Genital warts from HPV can be treated with medicine or by freezing, burning (electrocautery), or surgery. Warts may come back.   HPV cannot be cured with medicine or surgery. However, abnormal areas may be removed from the cervix, vagina, or vulva.   If your diagnosis is confirmed, your recent sexual partners need treatment. This is true even if they are symptom-free or have a negative culture or evaluation. They should not have sex until their health care  providers say it is okay. HOW CAN I REDUCE MY RISK OF GETTING AN STD? Take these steps to reduce your risk of getting an STD:  Use latex condoms, dental dams, and water-soluble lubricants during sexual activity. Do not use petroleum jelly or oils.  Avoid having multiple sex partners.  Do not have sex with someone who has other sex partners.  Do not have sex with anyone you do not know or who is at high risk for an STD.  Avoid risky sex practices  that can break your skin.  Do not have sex if you have open sores on your mouth or skin.  Avoid drinking too much alcohol or taking illegal drugs. Alcohol and drugs can affect your judgment and put you in a vulnerable position.  Avoid engaging in oral and anal sex acts.  Get vaccinated for HPV and hepatitis. If you have not received these vaccines in the past, talk to your health care provider about whether one or both might be right for you.   If you are at risk of being infected with HIV, it is recommended that you take a prescription medicine daily to prevent HIV infection. This is called pre-exposure prophylaxis (PrEP). You are considered at risk if:  You are a man who has sex with other men (MSM).  You are a heterosexual man or woman and are sexually active with more than one partner.  You take drugs by injection.  You are sexually active with a partner who has HIV.  Talk with your health care provider about whether you are at high risk of being infected with HIV. If you choose to begin PrEP, you should first be tested for HIV. You should then be tested every 3 months for as long as you are taking PrEP.  WHAT SHOULD I DO IF I THINK I HAVE AN STD?  See your health care provider.   Tell your sexual partner(s). They should be tested and treated for any STDs.  Do not have sex until your health care provider says it is okay. WHEN SHOULD I GET IMMEDIATE MEDICAL CARE? Contact your health care provider right away if:   You have severe abdominal pain.  You are a man and notice swelling or pain in your testicles.  You are a woman and notice swelling or pain in your vagina. Document Released: 01/22/2003 Document Revised: 11/06/2013 Document Reviewed: 05/22/2013 [facility] Patient Information 2015 [place]. This information is not intended to replace advice given to you by your health care provider. Make sure you discuss any questions you have with your health care  provider.  Safe Sex Safe sex is about reducing the risk of giving or getting a sexually transmitted disease (STD). STDs are spread through sexual contact involving the genitals, mouth, or rectum. Some STDs can be cured and others cannot. Safe sex can also prevent unintended pregnancies.  WHAT ARE SOME SAFE SEX PRACTICES?  Limit your sexual activity to only one partner who is having sex with only you.  Talk to your partner about his or her past partners, past STDs, and drug use.  Use a condom every time you have sexual intercourse. This includes vaginal, oral, and anal sexual activity. Both females and males should wear condoms during oral sex. Only use latex or polyurethane condoms and water-based lubricants. Using petroleum-based lubricants or oils to lubricate a condom will weaken the condom and increase the chance that it will break. The condom should be in place from the beginning to  the end of sexual activity. Wearing a condom reduces, but does not completely eliminate, your risk of getting or giving an STD. STDs can be spread by contact with infected body fluids and skin.  Get vaccinated for hepatitis B and HPV.  Avoid alcohol and recreational drugs, which can affect your judgment. You may forget to use a condom or participate in high-risk sex.  For females, avoid douching after sexual intercourse. Douching can spread an infection farther into the reproductive tract.  Check your body for signs of sores, blisters, rashes, or unusual discharge. See your health care provider if you notice any of these signs.  Avoid sexual contact if you have symptoms of an infection or are being treated for an STD. If you or your partner has herpes, avoid sexual contact when blisters are present. Use condoms at all other times.  If you are at risk of being infected with HIV, it is recommended that you take a prescription medicine daily to prevent HIV infection. This is called pre-exposure prophylaxis (PrEP).  You are considered at risk if:  You are a man who has sex with other men (MSM).  You are a heterosexual man or woman who is sexually active with more than one partner.  You take drugs by injection.  You are sexually active with a partner who has HIV.  Talk with your health care provider about whether you are at high risk of being infected with HIV. If you choose to begin PrEP, you should first be tested for HIV. You should then be tested every 3 months for as long as you are taking PrEP.  See your health care provider for regular screenings, exams, and tests for other STDs. Before having sex with a new partner, each of you should be screened for STDs and should talk about the results with each other. WHAT ARE THE BENEFITS OF SAFE SEX?   There is less chance of getting or giving an STD.  You can prevent unwanted or unintended pregnancies.  By discussing safe sex concerns with your partner, you may increase feelings of intimacy, comfort, trust, and honesty between the two of you. Document Released: 12/09/2004 Document Revised: 03/18/2014 Document Reviewed: 04/24/2012 [facility] Patient Information 2015 [place], [place]. This information is not intended to replace advice given to you by your health care provider. Make sure you discuss any questions you have with your health care provider.

## 2015-05-27 LAB — GC/CHLAMYDIA PROBE AMP (~~LOC~~) NOT AT ARMC
CHLAMYDIA, DNA PROBE: NEGATIVE
Neisseria Gonorrhea: POSITIVE — AB

## 2015-05-29 ENCOUNTER — Encounter (HOSPITAL_COMMUNITY): Payer: Self-pay

## 2015-05-29 ENCOUNTER — Emergency Department (HOSPITAL_COMMUNITY)
Admission: EM | Admit: 2015-05-29 | Discharge: 2015-05-29 | Disposition: A | Discharge status: Home / Self Care | Payer: Medicaid Other | Attending: Emergency Medicine | Admitting: Emergency Medicine

## 2015-05-29 ENCOUNTER — Telehealth (HOSPITAL_COMMUNITY): Payer: Self-pay

## 2015-05-29 DIAGNOSIS — A549 Gonococcal infection, unspecified: Secondary | ICD-10-CM | POA: Diagnosis not present

## 2015-05-29 DIAGNOSIS — A64 Unspecified sexually transmitted disease: Secondary | ICD-10-CM | POA: Insufficient documentation

## 2015-05-29 DIAGNOSIS — Z202 Contact with and (suspected) exposure to infections with a predominantly sexual mode of transmission: Secondary | ICD-10-CM | POA: Diagnosis present

## 2015-05-29 DIAGNOSIS — Z720 Tobacco use: Secondary | ICD-10-CM | POA: Diagnosis not present

## 2015-05-29 MED ORDER — CEFTRIAXONE SODIUM 250 MG IJ SOLR
250.0000 mg | Freq: Once | INTRAMUSCULAR | Status: AC
  Administered 2015-05-29: 250 mg via INTRAMUSCULAR
  Filled 2015-05-29: qty 250

## 2015-05-29 MED ORDER — AZITHROMYCIN 250 MG PO TABS
1000.0000 mg | ORAL_TABLET | Freq: Once | ORAL | Status: AC
  Administered 2015-05-29: 1000 mg via ORAL
  Filled 2015-05-29: qty 4

## 2015-05-29 MED ORDER — LIDOCAINE HCL (PF) 1 % IJ SOLN
0.9000 mL | Freq: Once | INTRAMUSCULAR | Status: AC
  Administered 2015-05-29: 0.9 mL
  Filled 2015-05-29: qty 5

## 2015-05-29 NOTE — ED Notes (Signed)
Chart returned from EDP office. Reviewed by Ebbie Ridgehris Lawyer. Pt needs to return for rocephin injection. Attempting to contact pt.

## 2015-05-29 NOTE — ED Notes (Signed)
Pt returned call . Informed of lab results. Per Jeremiah Christian Lawyer pt needs to return for tx of gonorrhea. Advised to notify partner(s) and to abstain from sexual activity until 10 days after treatment.

## 2015-05-29 NOTE — ED Notes (Signed)
Pt  Seen on 7/11 for STD check.  Pt called today and told tests was positive.  Here for treatment.

## 2015-05-29 NOTE — Discharge Instructions (Signed)
You have been treated for gonorrhea and chlamydia in the ER due to your positive test. You were tested for HIV at your last visit which was negative. Follow up with your regular doctor in 1 week as needed for ongoing symptoms. Always wear condoms. Avoid sexual activity for the next 10 days. Get all partners tested and treated. Return to the ER for changes or worsening symptoms.   Sexually Transmitted Disease A sexually transmitted disease (STD) is a disease or infection that may be passed (transmitted) from person to person, usually during sexual activity. This may happen by way of saliva, semen, blood, vaginal mucus, or urine. Common STDs include:   Gonorrhea.   Chlamydia.   Syphilis.   HIV and AIDS.   Genital herpes.   Hepatitis B and C.   Trichomonas.   Human papillomavirus (HPV).   Pubic lice.   Scabies.  Mites.  Bacterial vaginosis. WHAT ARE CAUSES OF STDs? An STD may be caused by bacteria, a virus, or parasites. STDs are often transmitted during sexual activity if one person is infected. However, they may also be transmitted through nonsexual means. STDs may be transmitted after:   Sexual intercourse with an infected person.   Sharing sex toys with an infected person.   Sharing needles with an infected person or using unclean piercing or tattoo needles.  Having intimate contact with the genitals, mouth, or rectal areas of an infected person.   Exposure to infected fluids during birth. WHAT ARE THE SIGNS AND SYMPTOMS OF STDs? Different STDs have different symptoms. Some people may not have any symptoms. If symptoms are present, they may include:   Painful or bloody urination.   Pain in the pelvis, abdomen, vagina, anus, throat, or eyes.   A skin rash, itching, or irritation.  Growths, ulcerations, blisters, or sores in the genital and anal areas.  Abnormal vaginal discharge with or without bad odor.   Penile discharge in men.   Fever.    Pain or bleeding during sexual intercourse.   Swollen glands in the groin area.   Yellow skin and eyes (jaundice). This is seen with hepatitis.   Swollen testicles.  Infertility.  Sores and blisters in the mouth. HOW ARE STDs DIAGNOSED? To make a diagnosis, your health care provider may:   Take a medical history.   Perform a physical exam.   Take a sample of any discharge to examine.  Swab the throat, cervix, opening to the penis, rectum, or vagina for testing.  Test a sample of your first morning urine.   Perform blood tests.   Perform a Pap test, if this applies.   Perform a colposcopy.   Perform a laparoscopy.  HOW ARE STDs TREATED? Treatment depends on the STD. Some STDs may be treated but not cured.   Chlamydia, gonorrhea, trichomonas, and syphilis can be cured with antibiotic medicine.   Genital herpes, hepatitis, and HIV can be treated, but not cured, with prescribed medicines. The medicines lessen symptoms.   Genital warts from HPV can be treated with medicine or by freezing, burning (electrocautery), or surgery. Warts may come back.   HPV cannot be cured with medicine or surgery. However, abnormal areas may be removed from the cervix, vagina, or vulva.   If your diagnosis is confirmed, your recent sexual partners need treatment. This is true even if they are symptom-free or have a negative culture or evaluation. They should not have sex until their health care providers say it is okay. HOW CAN I  REDUCE MY RISK OF GETTING AN STD? Take these steps to reduce your risk of getting an STD:  Use latex condoms, dental dams, and water-soluble lubricants during sexual activity. Do not use petroleum jelly or oils.  Avoid having multiple sex partners.  Do not have sex with someone who has other sex partners.  Do not have sex with anyone you do not know or who is at high risk for an STD.  Avoid risky sex practices that can break your skin.  Do  not have sex if you have open sores on your mouth or skin.  Avoid drinking too much alcohol or taking illegal drugs. Alcohol and drugs can affect your judgment and put you in a vulnerable position.  Avoid engaging in oral and anal sex acts.  Get vaccinated for HPV and hepatitis. If you have not received these vaccines in the past, talk to your health care provider about whether one or both might be right for you.   If you are at risk of being infected with HIV, it is recommended that you take a prescription medicine daily to prevent HIV infection. This is called pre-exposure prophylaxis (PrEP). You are considered at risk if:  You are a man who has sex with other men (MSM).  You are a heterosexual man or woman and are sexually active with more than one partner.  You take drugs by injection.  You are sexually active with a partner who has HIV.  Talk with your health care provider about whether you are at high risk of being infected with HIV. If you choose to begin PrEP, you should first be tested for HIV. You should then be tested every 3 months for as long as you are taking PrEP.  WHAT SHOULD I DO IF I THINK I HAVE AN STD?  See your health care provider.   Tell your sexual partner(s). They should be tested and treated for any STDs.  Do not have sex until your health care provider says it is okay. WHEN SHOULD I GET IMMEDIATE MEDICAL CARE? Contact your health care provider right away if:   You have severe abdominal pain.  You are a man and notice swelling or pain in your testicles.  You are a woman and notice swelling or pain in your vagina. Document Released: 01/22/2003 Document Revised: 11/06/2013 Document Reviewed: 05/22/2013 [facility] Patient Information 2015 [place], [place]. This information is not intended to replace advice given to you by your health care provider. Make sure you discuss any questions you have with your health care provider.  Gonorrhea Gonorrhea is an  infection that can cause serious problems. If left untreated, the infection may:   Damage the male or male organs.   Cause women to be unable to have children (sterility).   Harm a fetus if the infected woman is pregnant.  It is important to get treatment for gonorrhea as soon as possible. It is also necessary that all your sexual partners be tested for the infection.  CAUSES  Gonorrhea is caused by bacteria called Neisseria gonorrhoeae. The infection is spread from person to person, usually by sexual contact (such as by anal, vaginal, or oral means). A newborn can contract the infection from his or her mother during birth.  SYMPTOMS  Some people with gonorrhea do not have symptoms. Symptoms may be different in females and males.  Females The most common symptoms are:   Pain in the lower abdomen.   Fever with or without chills.  Other symptoms  include:   Abnormal vaginal discharge.   Painful intercourse.   Burning or itching of the vagina or lips of the vagina.   Abnormal vaginal bleeding.   Pain when urinating.   Long-lasting (chronic) pain in the lower abdomen, especially during menstruation or intercourse.   Inability to become pregnant.   Going into premature labor.   Irritation, pain, bleeding, or discharge from the rectum. This may occur if the infection was spread by anal sex.   Sore throat or swollen lymph nodes in the neck. This may occur if the infection was spread by oral sex.  Males The most common symptoms are:   Discharge from the penis.   Pain or burning during urination.   Pain or swelling in the testicles. Other symptoms may include:   Irritation, pain, bleeding, or discharge from the rectum. This may occur if the infection was spread by anal sex.   Sore throat, fever, or swollen lymph nodes in the neck. This may occur if the infection was spread by oral sex.  DIAGNOSIS  A diagnosis is made after a physical exam is done and  a sample of discharge is examined under a microscope for the presence of the bacteria. The discharge may be taken from the urethra, cervix, throat, or rectum.  TREATMENT  Gonorrhea is treated with antibiotic medicines. It is important for treatment to begin as soon as possible. Early treatment may prevent some problems from developing.  HOME CARE INSTRUCTIONS   Take medicines only as directed by your health care provider.   Take your antibiotic medicine as directed by your health care provider. Finish the antibiotic even if you start to feel better. Incomplete treatment will put you at risk for continued infection.   Do not have sex until treatment is complete or as directed by your health care provider.   Keep all follow-up visits as directed by your health care provider.   Not all test results are available during your visit. If your test results are not back during the visit, make an appointment with your health care provider to find out the results. Do not assume everything is normal if you have not heard from your health care provider or the medical facility. It is your responsibility to get your test results.  If you test positive for gonorrhea, inform your recent sexual partners. They need to be checked for gonorrhea even if they do not have symptoms. They may need treatment, even if they test negative for gonorrhea.  SEEK MEDICAL CARE IF:   You develop any bad reaction to the medicine you were prescribed. This may include:   A rash.   Nausea.   Vomiting.   Diarrhea.   Your symptoms do not improve after a few days of taking antibiotics.   Your symptoms get worse.   You develop increased pain, such as in the testicles (for males) or in the abdomen (for females).  You have a fever. MAKE SURE YOU:   Understand these instructions.  Will watch your condition.  Will get help right away if you are not doing well or get worse. Document Released: 10/29/2000  Document Revised: 03/18/2014 Document Reviewed: 05/09/2013 [facility] Patient Information 2015 [place], [place]. This information is not intended to replace advice given to you by your health care provider. Make sure you discuss any questions you have with your health care provider.

## 2015-05-29 NOTE — ED Provider Notes (Signed)
CSN: [id number]     Arrival date & time 05/29/15  1422 History   This chart was scribed for Jeremiah StraussMercedes Camprubi-Soms, PA-C working with Jeremiah FossaElizabeth Rees, MD by Jeremiah Christian, ED Scribe. This patient was seen in room WTR7/WTR7 and the patient's care was started at 2:31 PM.   No chief complaint on file.  Patient is a 19 y.o. male presenting with STD exposure. The history is provided by the patient. No language interpreter was used.  Exposure to STD This is a new problem. The current episode started 1 to 4 weeks ago. The problem occurs constantly. The problem has been unchanged. Pertinent negatives include no abdominal pain, arthralgias, chest pain, chills, fever, myalgias, nausea, numbness, urinary symptoms, vomiting or weakness. Nothing aggravates the symptoms. He has tried nothing for the symptoms. The treatment provided no relief.   HPI Comments: Jeremiah Christian is a [age] y.o. male who presents to the Emergency Department for treatment of gonorrhea after positive test. Patient evaluated in ED 7/11, requesting STD screening. Patient asymptomatic at time patient shared engaging in unprotected sex with his girlfriend for several weeks. Patient denies fever, chills, shortness of breath, chest pain, abdominal pain, nausea, vomiting, diarrhea, numbness, tingling, weakness, penile discharge, testicular pain or swelling, hematuria or additional GU symptoms.     Past Medical History  Diagnosis Date  . Preterm infant     born 2 months early   Past Surgical History  Procedure Laterality Date  . Neck surgery     Family History  Problem Relation Age of Onset  . Kidney failure Mother     ?genetic   History  Substance Use Topics  . Smoking status: Current Every Day Smoker -- 0.20 packs/day    Types: Cigarettes  . Smokeless tobacco: Never Used  . Alcohol Use: Yes     Comment: drinks liqour from bottle 2x per week "denies ETOH use"    Review of Systems  Constitutional: Negative for fever and chills.   Respiratory: Negative for shortness of breath.   Cardiovascular: Negative for chest pain.  Gastrointestinal: Negative for nausea, vomiting, abdominal pain and diarrhea.  Genitourinary: Negative for dysuria, hematuria, discharge and testicular pain.  Musculoskeletal: Negative for myalgias and arthralgias.  Skin: Negative for color change.  Allergic/Immunologic: Negative for immunocompromised state.  Neurological: Negative for weakness and numbness.  Psychiatric/Behavioral: Negative for confusion.  10 Systems reviewed and are negative for acute change except as noted in the HPI.   Allergies  Banana  Home Medications   Prior to Admission medications   Medication Sig Start Date End Date Taking? Authorizing Provider  amoxicillin (AMOXIL) 500 MG capsule Take 1 capsule (500 mg total) by mouth 3 (three) times daily. Patient not taking: Reported on 05/27/2015 08/06/14   Marlon Peliffany Greene, PA-C  guaiFENesin-codeine 100-10 MG/5ML syrup Take 5-10 mLs by mouth 3 (three) times daily as needed for cough. Patient not taking: Reported on 05/26/2015 08/06/14   Marlon Peliffany Greene, PA-C  OVER THE COUNTER MEDICATION 1 Can. Glucose Free Boost    Historical Provider, MD   BP 137/87 mmHg  Pulse 97  Temp(Src) 98.8 F (37.1 C) (Oral)  Resp 16  SpO2 98% Physical Exam  Constitutional: He is oriented to person, place, and time. Vital signs are normal. He appears well-developed and well-nourished.  Non-toxic appearance. No distress.  Afebrile, nontoxic, NAD  HENT:  Head: Normocephalic and atraumatic.  Mouth/Throat: Mucous membranes are normal.  Eyes: Conjunctivae and EOM are normal. Right eye exhibits no discharge. Left eye exhibits  no discharge.  Neck: Normal range of motion. Neck supple.  Cardiovascular: Normal rate.   Pulmonary/Chest: Effort normal. No respiratory distress.  Abdominal: Normal appearance. He exhibits no distension.  Musculoskeletal: Normal range of motion.  Neurological: He is alert and  oriented to person, place, and time. He has normal strength. No sensory deficit.  Skin: Skin is warm, dry and intact. No rash noted.  Psychiatric: He has a normal mood and affect.  Nursing note and vitals reviewed.   ED Course  Procedures (including critical care time)  COORDINATION OF CARE: 2:33 PM- Discussed treatment plan with patient at bedside and patient agreed to plan.   Labs Review Labs Reviewed - No data to display Results for orders placed or performed during the hospital encounter of 05/26/15  HIV antibody  Result Value Ref Range   HIV Screen 4th Generation wRfx Non Reactive Non Reactive  GC/Chlamydia probe amp ([facility])not at [facility]  Result Value Ref Range   Chlamydia Negative    Neisseria gonorrhea **POSITIVE** (A)     Imaging Review No results found.   EKG Interpretation None      MDM   Final diagnoses:  STD (male)  Gonorrhea    19 y.o. male here for tx for STD. Asymptomatic. Tested positive for gonorrhea. Will treat with IM rocephin and azithro. Discussed using condoms and no sexual activity for 10 days. Discussed getting all partners tested and treated. I explained the diagnosis and have given explicit precautions to return to the ER including for any other new or worsening symptoms. The patient understands and accepts the medical plan as it's been dictated and I have answered their questions. Discharge instructions concerning home care and prescriptions have been given. The patient is STABLE and is discharged to home in good condition.   I personally performed the services described in this documentation, which was scribed in my presence. The recorded information has been reviewed and is accurate.  BP 137/87 mmHg  Pulse 97  Temp(Src) 98.8 F (37.1 C) (Oral)  Resp 16  SpO2 98%  Meds ordered this encounter  Medications  . azithromycin (ZITHROMAX) tablet 1,000 mg    Sig:    And  . cefTRIAXone (ROCEPHIN) injection 250 mg    Sig:     Order  Specific Question:  Antibiotic Indication:    Answer:  STD  . lidocaine (PF) (XYLOCAINE) 1 % injection 0.9 mL    Sig:      Shriya Aker Camprubi-Soms, PA-C 05/29/15 1445  Jeremiah Fossa, MD 05/29/15 [phone]

## 2015-07-09 ENCOUNTER — Emergency Department (HOSPITAL_COMMUNITY)
Admission: EM | Admit: 2015-07-09 | Discharge: 2015-07-09 | Disposition: A | Discharge status: Home / Self Care | Payer: Medicaid Other | Attending: Emergency Medicine | Admitting: Emergency Medicine

## 2015-07-09 ENCOUNTER — Encounter (HOSPITAL_COMMUNITY): Payer: Self-pay

## 2015-07-09 DIAGNOSIS — L739 Follicular disorder, unspecified: Secondary | ICD-10-CM | POA: Diagnosis not present

## 2015-07-09 DIAGNOSIS — R21 Rash and other nonspecific skin eruption: Secondary | ICD-10-CM | POA: Diagnosis present

## 2015-07-09 DIAGNOSIS — Z8619 Personal history of other infectious and parasitic diseases: Secondary | ICD-10-CM | POA: Diagnosis not present

## 2015-07-09 DIAGNOSIS — Z720 Tobacco use: Secondary | ICD-10-CM | POA: Insufficient documentation

## 2015-07-09 DIAGNOSIS — L738 Other specified follicular disorders: Secondary | ICD-10-CM

## 2015-07-09 HISTORY — DX: Gonococcal infection, unspecified: A54.9

## 2015-07-09 LAB — CBC
HCT: 40.8 % (ref 39.0–52.0)
Hemoglobin: 13.1 g/dL (ref 13.0–17.0)
MCH: 27.3 pg (ref 26.0–34.0)
MCHC: 32.1 g/dL (ref 30.0–36.0)
MCV: 85 fL (ref 78.0–100.0)
PLATELETS: 215 10*3/uL (ref 150–400)
RBC: 4.8 MIL/uL (ref 4.22–5.81)
RDW: 14.4 % (ref 11.5–15.5)
WBC: 4.5 10*3/uL (ref 4.0–10.5)

## 2015-07-09 LAB — BASIC METABOLIC PANEL
Anion gap: 8 (ref 5–15)
BUN: 12 mg/dL (ref 6–20)
CALCIUM: 9.3 mg/dL (ref 8.9–10.3)
CO2: 27 mmol/L (ref 22–32)
CREATININE: 0.75 mg/dL (ref 0.61–1.24)
Chloride: 102 mmol/L (ref 101–111)
GFR calc non Af Amer: >60 mL/min (ref >60)
Glucose, Bld: 110 mg/dL — ABNORMAL HIGH (ref 65–99)
Potassium: 3.9 mmol/L (ref 3.5–5.1)
SODIUM: 137 mmol/L (ref 135–145)

## 2015-07-09 NOTE — ED Provider Notes (Signed)
CSN: [id number]     Arrival date & time 07/09/15  1107 History   First MD Initiated Contact with Patient 07/09/15 1303     Chief Complaint  Patient presents with  . Weakness  . Rash     (Consider location/radiation/quality/duration/timing/severity/associated sxs/prior Treatment) HPI Patient reports that he has some bumps since he went to the beach. He reports are actually getting better now. He shows me some very small papules that are in the groin and upper leg. He reports that he had been in a hot tub while he was at the beach. He reports he is feeling sleepy. He was sleeping as I came in the room and then he awakened and gave normal history. He does not have other associated symptoms of fever, myalgia, chills or generalized headache. Past Medical History  Diagnosis Date  . Preterm infant     born 2 months early  . Gonorrhea    Past Surgical History  Procedure Laterality Date  . Neck surgery     Family History  Problem Relation Age of Onset  . Kidney failure Mother     ?genetic   Social History  Substance Use Topics  . Smoking status: Current Every Day Smoker -- 0.20 packs/day    Types: Cigarettes  . Smokeless tobacco: Never Used  . Alcohol Use: Yes    Review of Systems 10 Systems reviewed and are negative for acute change except as noted in the HPI.    Allergies  Banana  Home Medications   Prior to Admission medications   Medication Sig Start Date End Date Taking? Authorizing Provider  vitamin C (ASCORBIC ACID) 500 MG tablet Take 500 mg by mouth daily.   Yes Historical Provider, MD   BP 127/63 mmHg  Pulse 59  Temp(Src) 97.8 F (36.6 C) (Oral)  Resp 14  SpO2 99% Physical Exam  Constitutional: He is oriented to person, place, and time. He appears well-developed and well-nourished.  HENT:  Head: Normocephalic and atraumatic.  Eyes: EOM are normal. Pupils are equal, round, and reactive to light.  Neck: Neck supple.  Cardiovascular: Normal rate, regular  rhythm, normal heart sounds and intact distal pulses.   Pulmonary/Chest: Effort normal and breath sounds normal.  Abdominal: Soft. Bowel sounds are normal. He exhibits no distension. There is no tenderness.  Musculoskeletal: Normal range of motion. He exhibits no edema.  Neurological: He is alert and oriented to person, place, and time. He has normal strength. Coordination normal. GCS eye subscore is 4. GCS verbal subscore is 5. GCS motor subscore is 6.  Skin: Skin is warm, dry and intact. Rash noted.  Patient has minimally perceptible papules on the anterior upper legs and over the hips. There are no vesicles. The remainder of the skin is without rash.  Psychiatric: He has a normal mood and affect.    ED Course  Procedures (including critical care time) Labs Review Labs Reviewed  BASIC METABOLIC PANEL - Abnormal; Notable for the following:    Glucose, Bld 110 (*)    All other components within normal limits  CBC  URINALYSIS, ROUTINE W REFLEX MICROSCOPIC (NOT AT [facility])    Imaging Review No results found. I have personally reviewed and evaluated these images and lab results as part of my medical decision-making.   EKG Interpretation None      MDM   Final diagnoses:  Hot tub folliculitis   Patient presented for rash. He has minimal rash at this time and he reports that it is  improving. He does state that the majority of the rashes in the groin region. Not appear to be an STD. This is in the areas of hair follicles and away from the genitals. He does give a history of having been at the beach and he did endorse having gotten into a hot tub. At this time with symptoms resolving and minimal findings in the bathing trunk region, this appears suggestive of a hot tub folliculitis. Patient's lungs and cardiac exam is normal. He does not have any hypoxia, fever or cough to suggest pneumonia. At this time he is discharged in good condition with recommended follow-up with family  physician.    Arby Barrette, MD 07/09/15 [phone]

## 2015-07-09 NOTE — ED Notes (Signed)
Pt c/o weakness, SOB, and generalized "bumps" x 2 weeks.  Denies pain.  Pt is a poor historian and continually mumbled during Triage.  Sts he was recently at the beach.

## 2015-07-09 NOTE — Discharge Instructions (Signed)

## 2015-07-12 ENCOUNTER — Emergency Department (HOSPITAL_COMMUNITY)
Admission: EM | Admit: 2015-07-12 | Discharge: 2015-07-12 | Discharge status: Against Medical Advice | Payer: Medicaid Other | Source: Home / Self Care

## 2015-07-12 ENCOUNTER — Encounter (HOSPITAL_COMMUNITY): Payer: Self-pay | Admitting: *Deleted

## 2015-07-12 ENCOUNTER — Emergency Department (HOSPITAL_COMMUNITY)
Admission: EM | Admit: 2015-07-12 | Discharge: 2015-07-12 | Disposition: A | Discharge status: Home / Self Care | Payer: Medicaid Other | Attending: Emergency Medicine | Admitting: Emergency Medicine

## 2015-07-12 DIAGNOSIS — Z79899 Other long term (current) drug therapy: Secondary | ICD-10-CM | POA: Diagnosis not present

## 2015-07-12 DIAGNOSIS — Z720 Tobacco use: Secondary | ICD-10-CM | POA: Diagnosis not present

## 2015-07-12 DIAGNOSIS — Z8619 Personal history of other infectious and parasitic diseases: Secondary | ICD-10-CM | POA: Insufficient documentation

## 2015-07-12 DIAGNOSIS — N4889 Other specified disorders of penis: Secondary | ICD-10-CM | POA: Insufficient documentation

## 2015-07-12 DIAGNOSIS — R369 Urethral discharge, unspecified: Secondary | ICD-10-CM | POA: Insufficient documentation

## 2015-07-12 DIAGNOSIS — R21 Rash and other nonspecific skin eruption: Secondary | ICD-10-CM

## 2015-07-12 MED ORDER — DOXYCYCLINE HYCLATE 100 MG PO CAPS: 100.0000 mg | ORAL_CAPSULE | Freq: Two times a day (BID) | ORAL | Status: DC

## 2015-07-12 MED ORDER — ACYCLOVIR 400 MG PO TABS: 400.0000 mg | ORAL_TABLET | Freq: Four times a day (QID) | ORAL | Status: DC

## 2015-07-12 NOTE — ED Notes (Signed)
Bed: WTR8 Expected date:  Expected time:  Means of arrival: Ambulance Comments: Ems-penile discharge and pain

## 2015-07-12 NOTE — ED Notes (Signed)
Bed: WLPT2 Expected date:  Expected time:  Means of arrival:  Comments: Blood draw

## 2015-07-12 NOTE — Discharge Instructions (Signed)

## 2015-07-12 NOTE — ED Notes (Addendum)
Pt complains of penile pain since 3PM today, white penile discharge since yesterday. Pt states the discharge may have been semen, but he is unsure. Pt states he was diagnosed with gonorrhea 2 weeks ago and treated with abx. Pt states he took molly for the first time last night and has not slept since.

## 2015-07-12 NOTE — ED Provider Notes (Signed)
CSN: [id number]     Arrival date & time 07/12/15  1526 History  This chart was scribed for Jeremiah Helper, PA-C, working with Lavera Guise, MD by Chestine Spore, ED Scribe. The patient was seen in room WTR8/WTR8 at 3:58 PM.    Chief Complaint  Patient presents with  . Penile Discharge  . Anxiety      The history is provided by the patient. No language interpreter was used.    HPI Comments: Jeremiah Christian is a 19 y.o. male with a PMHx of gonorreha who presents to the Emergency Department complaining of penile discharge onset yesterday. Pt reports that he is unsure if the discharge is semen or discharge and the discharge was white. Pt reports that he took molly last night and the day before. Pt notes that after he finished peeing the discharge would come out. Pt was dx with gonorrhea 1 month ago and he was treated but he didn't have any symptoms. Pt notes that he is bisexual with both anal and oral. Pt reports that he has had more than 5 partners in the past 6 months. He states that he is having associated symptoms of penile pain x 2 hours ago, urgency, and frequency. He denies fever, CP, SOB, and any other symptoms. Denies allergies to any medications.    Past Medical History  Diagnosis Date  . Preterm infant     born 2 months early  . Gonorrhea    Past Surgical History  Procedure Laterality Date  . Neck surgery     Family History  Problem Relation Age of Onset  . Kidney failure Mother     ?genetic   Social History  Substance Use Topics  . Smoking status: Current Every Day Smoker -- 0.20 packs/day    Types: Cigarettes  . Smokeless tobacco: Never Used  . Alcohol Use: Yes    Review of Systems  Constitutional: Negative for fever.  Respiratory: Negative for shortness of breath.   Cardiovascular: Negative for chest pain.  Genitourinary: Positive for discharge and penile pain. Negative for penile swelling, scrotal swelling and testicular pain.  Skin: Negative for color change, rash  and wound.      Allergies  Banana  Home Medications   Prior to Admission medications   Medication Sig Start Date End Date Taking? Authorizing Provider  vitamin C (ASCORBIC ACID) 500 MG tablet Take 500 mg by mouth daily.    Historical Provider, MD   BP 165/91 mmHg  Pulse 123  Temp(Src) 99 F (37.2 C)  Resp 18  SpO2 98% Physical Exam  Constitutional: He is oriented to person, place, and time. He appears well-developed and well-nourished. No distress.  HENT:  Head: Normocephalic and atraumatic.  Eyes: EOM are normal.  Neck: Neck supple. No tracheal deviation present.  Cardiovascular: Normal rate.   Pulmonary/Chest: Effort normal. No respiratory distress.  Abdominal: Hernia confirmed negative in the right inguinal area and confirmed negative in the left inguinal area.  Genitourinary: Right testis shows no swelling. Left testis shows no swelling. No discharge found.  Small blister noted to gland of penis. No discharge noted. Testicle with nl lie. No scrotal swelling.   Musculoskeletal: Normal range of motion.  Lymphadenopathy:       Right: No inguinal adenopathy present.       Left: No inguinal adenopathy present.  Neurological: He is alert and oriented to person, place, and time.  Skin: Skin is warm and dry.  Psychiatric: He has a normal mood and  affect. His behavior is normal.  Nursing note and vitals reviewed.   ED Course  Procedures (including critical care time) DIAGNOSTIC STUDIES: Oxygen Saturation is 98% on RA, nl by my interpretation.    COORDINATION OF CARE: 4:04 PM- Pt here c/o penile discharge.  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  He also has several small papules noted to shaft of penis and gland of penis.  Unsure if this is early signs of herpetic lesion due to location and risk factors.  Pt was diagnosed with hot tub folliculitis recently, but did not have any rash on penis.  Doubt hot tub folliculitis due to location of his  rash.  Will treat patient symptomatically for herpes and penile discharge with doxycycline x 2 weeks.  Discussed treatment plan with pt at bedside which includes doxycyline and acyclovir and pt agreed to plan.  i also recommend safe sex practice, to avoid sexual activities until sxs cleared.  Pt also found to be tachycardic.  He was anxious from the exam.  He is also recently on [facility] which can attributed his tachycardia.  No CP/SOB.  No fever.  Pt non toxic.  Tachycardia improves after recheck.     MDM   Final diagnoses:  Penile rash  Abnormal penile discharge    BP 165/85 mmHg  Pulse 101  Temp(Src) 99 F (37.2 C)  Resp 18  SpO2 98%   I personally performed the services described in this documentation, which was scribed in my presence. The recorded information has been reviewed and is accurate.     Jeremiah Helper, PA-C 07/12/15 1615  Lavera Guise, MD 07/13/15 1226

## 2015-11-17 ENCOUNTER — Encounter (HOSPITAL_COMMUNITY): Payer: Self-pay | Admitting: *Deleted

## 2015-11-17 ENCOUNTER — Emergency Department (HOSPITAL_COMMUNITY): Payer: No Typology Code available for payment source

## 2015-11-17 ENCOUNTER — Emergency Department (HOSPITAL_COMMUNITY)
Admission: EM | Admit: 2015-11-17 | Discharge: 2015-11-17 | Disposition: A | Discharge status: Home / Self Care | Payer: No Typology Code available for payment source | Attending: Emergency Medicine | Admitting: Emergency Medicine

## 2015-11-17 DIAGNOSIS — Y998 Other external cause status: Secondary | ICD-10-CM | POA: Insufficient documentation

## 2015-11-17 DIAGNOSIS — S299XXA Unspecified injury of thorax, initial encounter: Secondary | ICD-10-CM | POA: Insufficient documentation

## 2015-11-17 DIAGNOSIS — Z8619 Personal history of other infectious and parasitic diseases: Secondary | ICD-10-CM | POA: Insufficient documentation

## 2015-11-17 DIAGNOSIS — F1210 Cannabis abuse, uncomplicated: Secondary | ICD-10-CM | POA: Insufficient documentation

## 2015-11-17 DIAGNOSIS — R1110 Vomiting, unspecified: Secondary | ICD-10-CM | POA: Insufficient documentation

## 2015-11-17 DIAGNOSIS — R32 Unspecified urinary incontinence: Secondary | ICD-10-CM | POA: Insufficient documentation

## 2015-11-17 DIAGNOSIS — S79922A Unspecified injury of left thigh, initial encounter: Secondary | ICD-10-CM | POA: Insufficient documentation

## 2015-11-17 DIAGNOSIS — Y92410 Unspecified street and highway as the place of occurrence of the external cause: Secondary | ICD-10-CM | POA: Insufficient documentation

## 2015-11-17 DIAGNOSIS — Z79899 Other long term (current) drug therapy: Secondary | ICD-10-CM | POA: Insufficient documentation

## 2015-11-17 DIAGNOSIS — Z792 Long term (current) use of antibiotics: Secondary | ICD-10-CM | POA: Insufficient documentation

## 2015-11-17 DIAGNOSIS — S3991XA Unspecified injury of abdomen, initial encounter: Secondary | ICD-10-CM | POA: Insufficient documentation

## 2015-11-17 DIAGNOSIS — F17210 Nicotine dependence, cigarettes, uncomplicated: Secondary | ICD-10-CM | POA: Insufficient documentation

## 2015-11-17 DIAGNOSIS — Y9389 Activity, other specified: Secondary | ICD-10-CM | POA: Insufficient documentation

## 2015-11-17 DIAGNOSIS — S79912A Unspecified injury of left hip, initial encounter: Secondary | ICD-10-CM | POA: Insufficient documentation

## 2015-11-17 MED ORDER — IBUPROFEN 400 MG PO TABS
600.0000 mg | ORAL_TABLET | Freq: Once | ORAL | Status: AC
  Administered 2015-11-17: 18:00:00 600 mg via ORAL
  Filled 2015-11-17: qty 1

## 2015-11-17 MED ORDER — METHOCARBAMOL 500 MG PO TABS
1000.0000 mg | ORAL_TABLET | Freq: Once | ORAL | Status: AC
  Administered 2015-11-17: 1000 mg via ORAL
  Filled 2015-11-17: qty 2

## 2015-11-17 MED ORDER — METHOCARBAMOL 500 MG PO TABS: 500.0000 mg | ORAL_TABLET | Freq: Two times a day (BID) | ORAL | Status: DC

## 2015-11-17 MED ORDER — IBUPROFEN 800 MG PO TABS: 800.0000 mg | ORAL_TABLET | Freq: Three times a day (TID) | ORAL | Status: DC

## 2015-11-17 MED ORDER — METHOCARBAMOL 1000 MG/10ML IJ SOLN: 1000.0000 mg | Freq: Once | INTRAMUSCULAR | Status: DC
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
## 2015-11-17 NOTE — ED Notes (Signed)
See PA assessment 

## 2016-02-10 ENCOUNTER — Encounter (HOSPITAL_COMMUNITY): Payer: Self-pay | Admitting: Emergency Medicine

## 2016-02-10 ENCOUNTER — Emergency Department (HOSPITAL_COMMUNITY): Payer: Self-pay

## 2016-02-10 ENCOUNTER — Emergency Department (HOSPITAL_COMMUNITY)
Admission: EM | Admit: 2016-02-10 | Discharge: 2016-02-10 | Disposition: A | Discharge status: Home / Self Care | Payer: Self-pay | Attending: Emergency Medicine | Admitting: Emergency Medicine

## 2016-02-10 DIAGNOSIS — Z8619 Personal history of other infectious and parasitic diseases: Secondary | ICD-10-CM | POA: Insufficient documentation

## 2016-02-10 DIAGNOSIS — Y9389 Activity, other specified: Secondary | ICD-10-CM | POA: Insufficient documentation

## 2016-02-10 DIAGNOSIS — Y92410 Unspecified street and highway as the place of occurrence of the external cause: Secondary | ICD-10-CM | POA: Insufficient documentation

## 2016-02-10 DIAGNOSIS — F10129 Alcohol abuse with intoxication, unspecified: Secondary | ICD-10-CM | POA: Insufficient documentation

## 2016-02-10 DIAGNOSIS — F17210 Nicotine dependence, cigarettes, uncomplicated: Secondary | ICD-10-CM | POA: Insufficient documentation

## 2016-02-10 DIAGNOSIS — IMO0002 Reserved for concepts with insufficient information to code with codable children: Secondary | ICD-10-CM

## 2016-02-10 DIAGNOSIS — S52511A Displaced fracture of right radial styloid process, initial encounter for closed fracture: Secondary | ICD-10-CM | POA: Insufficient documentation

## 2016-02-10 DIAGNOSIS — Y998 Other external cause status: Secondary | ICD-10-CM | POA: Insufficient documentation

## 2016-02-10 LAB — CBC
HCT: 40 % (ref 39.0–52.0)
HEMOGLOBIN: 13.1 g/dL (ref 13.0–17.0)
MCH: 27.4 pg (ref 26.0–34.0)
MCHC: 32.8 g/dL (ref 30.0–36.0)
MCV: 83.7 fL (ref 78.0–100.0)
Platelets: 253 10*3/uL (ref 150–400)
RBC: 4.78 MIL/uL (ref 4.22–5.81)
RDW: 13.6 % (ref 11.5–15.5)
WBC: 6.6 10*3/uL (ref 4.0–10.5)

## 2016-02-10 LAB — COMPREHENSIVE METABOLIC PANEL
ALT: 20 U/L (ref 17–63)
AST: 30 U/L (ref 15–41)
Albumin: 4.7 g/dL (ref 3.5–5.0)
Alkaline Phosphatase: 92 U/L (ref 38–126)
Anion gap: 12 (ref 5–15)
BUN: 9 mg/dL (ref 6–20)
CHLORIDE: 105 mmol/L (ref 101–111)
CO2: 25 mmol/L (ref 22–32)
CREATININE: 0.97 mg/dL (ref 0.61–1.24)
Calcium: 9.3 mg/dL (ref 8.9–10.3)
GFR calc non Af Amer: >60 mL/min (ref >60)
Glucose, Bld: 99 mg/dL (ref 65–99)
POTASSIUM: 3.8 mmol/L (ref 3.5–5.1)
SODIUM: 142 mmol/L (ref 135–145)
Total Bilirubin: 1.3 mg/dL — ABNORMAL HIGH (ref 0.3–1.2)
Total Protein: 7.6 g/dL (ref 6.5–8.1)

## 2016-02-10 LAB — ETHANOL: Alcohol, Ethyl (B): 152 mg/dL — ABNORMAL HIGH (ref <5)

## 2016-02-10 LAB — ACETAMINOPHEN LEVEL

## 2016-02-10 LAB — SALICYLATE LEVEL

## 2016-02-10 MED ORDER — IBUPROFEN 800 MG PO TABS: 800.0000 mg | ORAL_TABLET | Freq: Three times a day (TID) | ORAL | Status: DC
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
## 2016-02-10 NOTE — ED Notes (Signed)
Ortho tech at bedside 
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
## 2016-02-10 NOTE — ED Notes (Signed)
Bed: [facility] Expected date:  Expected time:  Means of arrival:  Comments: 
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
## 2016-02-10 NOTE — Discharge Instructions (Signed)
Wrist Fracture °A wrist fracture is a break or crack in one of the bones of your wrist. Your wrist is made up of eight small bones at the palm of your hand (carpal bones) and two long bones that make up your forearm (radius and ulna). °CAUSES °· A direct blow to the wrist. °· Falling on an outstretched hand. °· Trauma, such as a car accident or a fall. °RISK FACTORS °Risk factors for wrist fracture include: °· Participating in contact and high-risk sports, such as skiing, biking, and ice skating. °· Taking steroid medicines. °· Smoking. °· Being male. °· Being Caucasian. °· Drinking more than three alcoholic beverages per day. °· Having low or lowered bone density (osteoporosis or osteopenia). °· Age. Older adults have decreased bone density. °· Women who have had menopause. °· History of previous fractures. °SIGNS AND SYMPTOMS °Symptoms of wrist fractures include tenderness, bruising, and inflammation. Additionally, the wrist may hang in an odd position or appear deformed. °DIAGNOSIS °Diagnosis may include: °· Physical exam. °· X-ray. °TREATMENT °Treatment depends on many factors, including the nature and location of the fracture, your age, and your activity level. Treatment for wrist fracture can be nonsurgical or surgical. °Nonsurgical Treatment °A plaster cast or splint may be applied to your wrist if the bone is in a good position. If the fracture is not in good position, it may be necessary for your health care provider to realign it before applying a splint or cast. Usually, a cast or splint will be worn for several weeks. °Surgical Treatment °Sometimes the position of the bone is so far out of place that surgery is required to apply a device to hold it together as it heals. Depending on the fracture, there are a number of options for holding the bone in place while it heals, such as a cast and metal pins. °HOME CARE INSTRUCTIONS °· Keep your injured wrist elevated and move your fingers as much as  possible. °· Do not put pressure on any part of your cast or splint. It may break. °· Use a plastic bag to protect your cast or splint from water while bathing or showering. Do not lower your cast or splint into water. °· Take medicines only as directed by your health care provider. °· Keep your cast or splint clean and dry. If it becomes wet, damaged, or suddenly feels too tight, contact your health care provider right away. °· Do not use any tobacco products including cigarettes, chewing tobacco, or electronic cigarettes. Tobacco can delay bone healing. If you need help quitting, ask your health care provider. °· Keep all follow-up visits as directed by your health care provider. This is important. °· Ask your health care provider if you should take supplements of calcium and vitamins C and D to promote bone healing. °SEEK MEDICAL CARE IF: °· Your cast or splint is damaged, breaks, or gets wet. °· You have a fever. °· You have chills. °· You have continued severe pain or more swelling than you did before the cast was put on. °SEEK IMMEDIATE MEDICAL CARE IF: °· Your hand or fingernails on the injured arm turn blue or gray, or feel cold or numb. °· You have decreased feeling in the fingers of your injured arm. °MAKE SURE YOU: °· Understand these instructions. °· Will watch your condition. °· Will get help right away if you are not doing well or get worse. °  °This information is not intended to replace advice given to you by your   health care provider. Make sure you discuss any questions you have with your health care provider. °  °Document Released: 08/11/2005 Document Revised: 07/23/2015 Document Reviewed: 11/19/2011 °Elsevier Interactive Patient Education ©2016 [employer] ° °

## 2016-02-10 NOTE — ED Notes (Signed)
Discharge instructions, follow up care, and rx x1 reviewed with patient. Patient verbalized understanding. 

## 2016-02-10 NOTE — ED Notes (Signed)
Patient ambulated from triage to room without difficulty. 

## 2017-02-17 ENCOUNTER — Encounter (HOSPITAL_COMMUNITY): Payer: Self-pay | Admitting: Emergency Medicine

## 2017-02-17 ENCOUNTER — Emergency Department (HOSPITAL_COMMUNITY)
Admission: EM | Admit: 2017-02-17 | Discharge: 2017-02-17 | Disposition: A | Discharge status: Home / Self Care | Payer: Medicaid Other | Attending: Emergency Medicine | Admitting: Emergency Medicine

## 2017-02-17 DIAGNOSIS — Z202 Contact with and (suspected) exposure to infections with a predominantly sexual mode of transmission: Secondary | ICD-10-CM

## 2017-02-17 DIAGNOSIS — F17210 Nicotine dependence, cigarettes, uncomplicated: Secondary | ICD-10-CM | POA: Insufficient documentation

## 2017-02-17 LAB — HIV ANTIBODY (ROUTINE TESTING W REFLEX): HIV Screen 4th Generation wRfx: NONREACTIVE

## 2017-02-17 LAB — RPR: RPR Ser Ql: NONREACTIVE

## 2017-02-17 LAB — GC/CHLAMYDIA PROBE AMP (~~LOC~~) NOT AT ARMC
CHLAMYDIA, DNA PROBE: NEGATIVE
Neisseria Gonorrhea: NEGATIVE

## 2017-02-17 MED ORDER — STERILE WATER FOR INJECTION IJ SOLN
0.9000 mL | Freq: Once | INTRAMUSCULAR | Status: AC
  Administered 2017-02-17: 0.9 mL via INTRAMUSCULAR

## 2017-02-17 MED ORDER — AZITHROMYCIN 250 MG PO TABS
1000.0000 mg | ORAL_TABLET | Freq: Once | ORAL | Status: AC
  Administered 2017-02-17: 1000 mg via ORAL
  Filled 2017-02-17: qty 4

## 2017-02-17 MED ORDER — CEFTRIAXONE SODIUM 250 MG IJ SOLR
250.0000 mg | Freq: Once | INTRAMUSCULAR | Status: AC
  Administered 2017-02-17: 250 mg via INTRAMUSCULAR
  Filled 2017-02-17: qty 250

## 2017-02-17 MED ORDER — AZITHROMYCIN 1 G PO PACK: 1.0000 g | PACK | Freq: Once | ORAL | Status: DC

## 2017-02-17 MED ORDER — METRONIDAZOLE 500 MG PO TABS
2000.0000 mg | ORAL_TABLET | Freq: Once | ORAL | Status: AC
Start: 2017-02-17 — End: 2017-02-17
  Administered 2017-02-17: 2000 mg via ORAL
  Filled 2017-02-17: qty 4

## 2017-02-17 MED ORDER — STERILE WATER FOR INJECTION IJ SOLN
INTRAMUSCULAR | Status: AC
  Filled 2017-02-17: qty 10

## 2017-02-17 MED ORDER — ONDANSETRON 4 MG PO TBDP
4.0000 mg | ORAL_TABLET | Freq: Once | ORAL | Status: AC
  Administered 2017-02-17: 4 mg via ORAL
  Filled 2017-02-17: qty 1

## 2017-02-17 NOTE — ED Triage Notes (Signed)
Pt from home with c/o STI exposure.  Denies pain or symptoms.  NAD, A&O, ambulatory.
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
## 2017-03-16 ENCOUNTER — Emergency Department (HOSPITAL_COMMUNITY): Payer: Self-pay

## 2017-03-16 ENCOUNTER — Emergency Department (HOSPITAL_COMMUNITY)
Admission: EM | Admit: 2017-03-16 | Discharge: 2017-03-16 | Disposition: A | Discharge status: Home / Self Care | Payer: Self-pay | Attending: Physician Assistant | Admitting: Physician Assistant

## 2017-03-16 ENCOUNTER — Encounter (HOSPITAL_COMMUNITY): Payer: Self-pay | Admitting: *Deleted

## 2017-03-16 DIAGNOSIS — Y999 Unspecified external cause status: Secondary | ICD-10-CM | POA: Insufficient documentation

## 2017-03-16 DIAGNOSIS — S50811A Abrasion of right forearm, initial encounter: Secondary | ICD-10-CM | POA: Insufficient documentation

## 2017-03-16 DIAGNOSIS — Y929 Unspecified place or not applicable: Secondary | ICD-10-CM | POA: Insufficient documentation

## 2017-03-16 DIAGNOSIS — S4991XA Unspecified injury of right shoulder and upper arm, initial encounter: Secondary | ICD-10-CM

## 2017-03-16 DIAGNOSIS — F17210 Nicotine dependence, cigarettes, uncomplicated: Secondary | ICD-10-CM | POA: Insufficient documentation

## 2017-03-16 DIAGNOSIS — S60512A Abrasion of left hand, initial encounter: Secondary | ICD-10-CM | POA: Insufficient documentation

## 2017-03-16 DIAGNOSIS — Y939 Activity, unspecified: Secondary | ICD-10-CM | POA: Insufficient documentation

## 2017-03-16 DIAGNOSIS — W228XXA Striking against or struck by other objects, initial encounter: Secondary | ICD-10-CM | POA: Insufficient documentation

## 2017-03-16 MED ORDER — KETOROLAC TROMETHAMINE 30 MG/ML IJ SOLN
30.0000 mg | Freq: Once | INTRAMUSCULAR | Status: AC
  Administered 2017-03-16: 30 mg via INTRAMUSCULAR
  Filled 2017-03-16: qty 1

## 2017-03-16 MED ORDER — TETANUS-DIPHTH-ACELL PERTUSSIS 5-2.5-18.5 LF-MCG/0.5 IM SUSP
0.5000 mL | Freq: Once | INTRAMUSCULAR | Status: AC
  Administered 2017-03-16: 0.5 mL via INTRAMUSCULAR
  Filled 2017-03-16: qty 0.5
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
## 2017-03-16 NOTE — Progress Notes (Signed)
Orthopedic Tech Progress Note Patient Details:  Jeremiah Christian 1996/05/11 [id number]  Ortho Devices Type of Ortho Device: Velcro wrist forearm splint Ortho Device/Splint Location: RUE Ortho Device/Splint Interventions: Ordered, Application   Jennye Moccasin 03/16/2017, 4:50 PM

## 2017-03-16 NOTE — ED Triage Notes (Signed)
Pt reports injuring right arm one week ago by hitting something. Still has pain to hand and forearm. No obv injury noted.

## 2017-03-16 NOTE — Discharge Instructions (Signed)
X-ray showed no fracture. When the splint for comfort. Rest, ice, elevate the right arm. Motrin and Tylenol for pain. Watch for signs of infection. Follow-up with your primary care doctor or see orthopedic doctor if her symptoms not improve. Return to the ED if your symptoms worsen.

## 2017-03-20 ENCOUNTER — Emergency Department (HOSPITAL_COMMUNITY): Payer: Medicaid Other

## 2017-03-20 ENCOUNTER — Encounter (HOSPITAL_COMMUNITY): Payer: Self-pay

## 2017-03-20 ENCOUNTER — Emergency Department (HOSPITAL_COMMUNITY)
Admission: EM | Admit: 2017-03-20 | Discharge: 2017-03-20 | Disposition: A | Discharge status: Home / Self Care | Payer: Medicaid Other | Attending: Emergency Medicine | Admitting: Emergency Medicine

## 2017-03-20 DIAGNOSIS — S50311A Abrasion of right elbow, initial encounter: Secondary | ICD-10-CM | POA: Insufficient documentation

## 2017-03-20 DIAGNOSIS — S20229A Contusion of unspecified back wall of thorax, initial encounter: Secondary | ICD-10-CM | POA: Insufficient documentation

## 2017-03-20 DIAGNOSIS — F17210 Nicotine dependence, cigarettes, uncomplicated: Secondary | ICD-10-CM | POA: Insufficient documentation

## 2017-03-20 DIAGNOSIS — S50312A Abrasion of left elbow, initial encounter: Secondary | ICD-10-CM | POA: Insufficient documentation

## 2017-03-20 DIAGNOSIS — S30811A Abrasion of abdominal wall, initial encounter: Secondary | ICD-10-CM | POA: Insufficient documentation

## 2017-03-20 DIAGNOSIS — T07XXXA Unspecified multiple injuries, initial encounter: Secondary | ICD-10-CM

## 2017-03-20 DIAGNOSIS — Y9289 Other specified places as the place of occurrence of the external cause: Secondary | ICD-10-CM | POA: Insufficient documentation

## 2017-03-20 DIAGNOSIS — Y999 Unspecified external cause status: Secondary | ICD-10-CM | POA: Insufficient documentation

## 2017-03-20 DIAGNOSIS — S40011A Contusion of right shoulder, initial encounter: Secondary | ICD-10-CM | POA: Insufficient documentation

## 2017-03-20 DIAGNOSIS — S0990XD Unspecified injury of head, subsequent encounter: Secondary | ICD-10-CM

## 2017-03-20 DIAGNOSIS — S70312A Abrasion, left thigh, initial encounter: Secondary | ICD-10-CM | POA: Insufficient documentation

## 2017-03-20 DIAGNOSIS — Y9389 Activity, other specified: Secondary | ICD-10-CM | POA: Insufficient documentation

## 2017-03-20 DIAGNOSIS — S0502XA Injury of conjunctiva and corneal abrasion without foreign body, left eye, initial encounter: Secondary | ICD-10-CM | POA: Insufficient documentation

## 2017-03-20 MED ORDER — TOBRAMYCIN 0.3 % OP SOLN
2.0000 [drp] | OPHTHALMIC | Status: DC
  Administered 2017-03-20: 2 [drp] via OPHTHALMIC
  Filled 2017-03-20: qty 5

## 2017-03-20 MED ORDER — OXYCODONE-ACETAMINOPHEN 5-325 MG PO TABS
1.0000 | ORAL_TABLET | Freq: Once | ORAL | Status: AC
  Administered 2017-03-20: 1 via ORAL
  Filled 2017-03-20: qty 1

## 2017-03-20 MED ORDER — FLUORESCEIN SODIUM 0.6 MG OP STRP
1.0000 | ORAL_STRIP | Freq: Once | OPHTHALMIC | Status: AC
  Administered 2017-03-20: 1 via OPHTHALMIC
  Filled 2017-03-20: qty 1

## 2017-03-20 MED ORDER — KETOROLAC TROMETHAMINE 0.5 % OP SOLN
1.0000 [drp] | Freq: Four times a day (QID) | OPHTHALMIC | Status: DC
  Administered 2017-03-20: 1 [drp] via OPHTHALMIC
  Filled 2017-03-20: qty 3

## 2017-03-20 MED ORDER — TETRACAINE HCL 0.5 % OP SOLN
2.0000 [drp] | Freq: Once | OPHTHALMIC | Status: AC
  Administered 2017-03-20: 2 [drp] via OPHTHALMIC
  Filled 2017-03-20: qty 2

## 2017-03-20 NOTE — Discharge Instructions (Signed)
Use the Tobrex drops in the left eye, 2 every 4 hours, for 3 days, or until the eye discomfort improves.  Use the ketorolac drops, 1 in the left eye, every 6 hours for pain.  For general pain, use ibuprofen 400 mg 3 times a day.  Keep the wounds clean with soap and water 2 or 3 times a day.  Follow-up with a primary care doctor if needed for problems or no improvement in 3 or 4 days.
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
## 2017-03-20 NOTE — ED Notes (Signed)
Pt returned from xray at this

## 2017-03-20 NOTE — ED Notes (Signed)
ED Provider at bedside. 
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
## 2017-03-20 NOTE — ED Notes (Signed)
Pt transported to xray at this time

## 2017-07-14 ENCOUNTER — Emergency Department (HOSPITAL_COMMUNITY)
Admission: EM | Admit: 2017-07-14 | Discharge: 2017-07-15 | Discharge status: Against Medical Advice | Payer: Medicaid Other

## 2017-07-14 NOTE — ED Notes (Signed)
Called 3x, no response.  

## 2017-07-14 NOTE — ED Notes (Signed)
Called for triage x1, no answer 

## 2017-12-25 ENCOUNTER — Emergency Department (HOSPITAL_COMMUNITY)
Admission: EM | Admit: 2017-12-25 | Discharge: 2017-12-25 | Disposition: A | Discharge status: Home / Self Care | Payer: Self-pay | Attending: Physician Assistant | Admitting: Physician Assistant

## 2017-12-25 ENCOUNTER — Encounter (HOSPITAL_COMMUNITY): Payer: Self-pay

## 2017-12-25 ENCOUNTER — Other Ambulatory Visit: Payer: Self-pay

## 2017-12-25 ENCOUNTER — Encounter (HOSPITAL_COMMUNITY): Payer: Self-pay | Admitting: Emergency Medicine

## 2017-12-25 ENCOUNTER — Emergency Department (HOSPITAL_COMMUNITY)
Admission: EM | Admit: 2017-12-25 | Discharge: 2017-12-25 | Disposition: A | Discharge status: Home / Self Care | Payer: Self-pay | Attending: Emergency Medicine | Admitting: Emergency Medicine

## 2017-12-25 DIAGNOSIS — K0889 Other specified disorders of teeth and supporting structures: Secondary | ICD-10-CM | POA: Insufficient documentation

## 2017-12-25 DIAGNOSIS — F17210 Nicotine dependence, cigarettes, uncomplicated: Secondary | ICD-10-CM | POA: Insufficient documentation

## 2017-12-25 DIAGNOSIS — Z5321 Procedure and treatment not carried out due to patient leaving prior to being seen by health care provider: Secondary | ICD-10-CM | POA: Insufficient documentation

## 2017-12-25 MED ORDER — IBUPROFEN 600 MG PO TABS: 600.0000 mg | ORAL_TABLET | Freq: Four times a day (QID) | ORAL | 0 refills | Status: DC | PRN

## 2017-12-25 MED ORDER — PENICILLIN V POTASSIUM 500 MG PO TABS: 500.0000 mg | ORAL_TABLET | Freq: Four times a day (QID) | ORAL | 0 refills | Status: AC

## 2017-12-25 MED ORDER — LIDOCAINE VISCOUS 2 % MT SOLN: 15.0000 mL | OROMUCOSAL | 2 refills | Status: DC | PRN

## 2017-12-25 NOTE — ED Triage Notes (Signed)
Per Pt, Pt is coming from home with complaints of right sided dental pain and facial swelling that started a month ago. Pt reports the pain has been intermittent.

## 2017-12-25 NOTE — ED Triage Notes (Signed)
Patient reports bad tooth on right lower side that has been bothering him for months and months.  Swelling noted to jaw and under right ear.

## 2017-12-25 NOTE — Discharge Instructions (Signed)
You have been seen today for dental pain. You should follow up with a dentist as soon as possible. This problem will not resolve on its own without the care of a dentist. Use ibuprofen or naproxen for pain. Use the viscous lidocaine for mouth pain. Swish with the lidocaine and spit it out. Do not swallow it. ° °Antiinflammatory medications: Take 600 mg of ibuprofen every 6 hours or 440 mg (over the counter dose) to 500 mg (prescription dose) of naproxen every 12 hours for the next 3 days. After this time, these medications may be used as needed for pain. Take these medications with food to avoid upset stomach. Choose only one of these medications, do not take them together. °Tylenol: Should you continue to have additional pain while taking the ibuprofen or naproxen, you may add in tylenol as needed. Your daily total maximum amount of tylenol from all sources should be limited to 4000mg/day for persons without liver problems, or 2000mg/day for those with liver problems. ° °Please take all of your antibiotics until finished!   You may develop abdominal discomfort or diarrhea from the antibiotic.  You may help offset this with probiotics which you can buy or get in yogurt. Do not eat or take the probiotics until 2 hours after your antibiotic.  ° °Dental Resource Guide ° °[facility] °[address], [address] °[place], [place] [address] °[phone] ° °[facility] °[address] °[place], [place] [address] °[phone] ° °[facility] °[address] °[place], [place] [address] °[phone] ext. [phone] ° °[facility] °[address] °[place], [place] [address] °[phone] ° °[facility] °[address] °[place], [place] [address] °[phone] ° °[employer] °[facility]ww.[facility].[employer].edu/patientcare/studentclinics/becomepatient ° °[employer] [facility] [employer] [facility] °[address] °[place], [place] [address] °[phone] ° °Website for free, low-income, or sliding scale dental services in [place]: °www.freedental.us ° °To find a dentist in [place] and surrounding areas: °www.ncdental.org/for-the-public/find-a-dentist ° °[facility] °http://www.ncdental.org/meetings-events/[place] ° °[place] [facility] °https://dma.ncdhhs.gov/find-a-doctor/medicaid-dental-providers ° °
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
## 2018-04-14 ENCOUNTER — Encounter (HOSPITAL_COMMUNITY): Payer: Self-pay

## 2018-04-14 ENCOUNTER — Emergency Department (HOSPITAL_COMMUNITY)
Admission: EM | Admit: 2018-04-14 | Discharge: 2018-04-15 | Discharge status: Against Medical Advice | Payer: Self-pay | Attending: Emergency Medicine | Admitting: Emergency Medicine

## 2018-04-14 DIAGNOSIS — Z5321 Procedure and treatment not carried out due to patient leaving prior to being seen by health care provider: Secondary | ICD-10-CM | POA: Insufficient documentation

## 2018-04-14 DIAGNOSIS — R109 Unspecified abdominal pain: Secondary | ICD-10-CM | POA: Insufficient documentation

## 2018-04-14 LAB — COMPREHENSIVE METABOLIC PANEL
ALT: 30 U/L (ref 17–63)
AST: 32 U/L (ref 15–41)
Albumin: 4.6 g/dL (ref 3.5–5.0)
Alkaline Phosphatase: 86 U/L (ref 38–126)
Anion gap: 8 (ref 5–15)
BUN: 14 mg/dL (ref 6–20)
CHLORIDE: 106 mmol/L (ref 101–111)
CO2: 27 mmol/L (ref 22–32)
Calcium: 9.2 mg/dL (ref 8.9–10.3)
Creatinine, Ser: 0.93 mg/dL (ref 0.61–1.24)
Glucose, Bld: 98 mg/dL (ref 65–99)
POTASSIUM: 3.4 mmol/L — AB (ref 3.5–5.1)
Sodium: 141 mmol/L (ref 135–145)
Total Bilirubin: 2.1 mg/dL — ABNORMAL HIGH (ref 0.3–1.2)
Total Protein: 7.3 g/dL (ref 6.5–8.1)

## 2018-04-14 LAB — CBC
HEMATOCRIT: 42.1 % (ref 39.0–52.0)
Hemoglobin: 13.4 g/dL (ref 13.0–17.0)
MCH: 27.2 pg (ref 26.0–34.0)
MCHC: 31.8 g/dL (ref 30.0–36.0)
MCV: 85.4 fL (ref 78.0–100.0)
Platelets: 288 10*3/uL (ref 150–400)
RBC: 4.93 MIL/uL (ref 4.22–5.81)
RDW: 14.6 % (ref 11.5–15.5)
WBC: 7.7 10*3/uL (ref 4.0–10.5)

## 2018-04-14 LAB — LIPASE, BLOOD: LIPASE: 26 U/L (ref 11–51)

## 2018-04-14 NOTE — ED Triage Notes (Signed)
Pt presents with c/o abdominal pain that started for the past week. Pt reports some diarrhea. Pt ambulatory, NAD.

## 2018-04-15 ENCOUNTER — Inpatient Hospital Stay (HOSPITAL_COMMUNITY)
Admission: EM | Admit: 2018-04-15 | Discharge: 2018-04-17 | DRG: 419 | Disposition: A | Discharge status: Home / Self Care | Payer: Self-pay | Attending: General Surgery | Admitting: General Surgery

## 2018-04-15 ENCOUNTER — Encounter (HOSPITAL_COMMUNITY): Payer: Self-pay

## 2018-04-15 ENCOUNTER — Other Ambulatory Visit: Payer: Self-pay

## 2018-04-15 ENCOUNTER — Emergency Department (HOSPITAL_COMMUNITY): Payer: Self-pay

## 2018-04-15 DIAGNOSIS — K8000 Calculus of gallbladder with acute cholecystitis without obstruction: Principal | ICD-10-CM | POA: Diagnosis present

## 2018-04-15 DIAGNOSIS — Z91018 Allergy to other foods: Secondary | ICD-10-CM

## 2018-04-15 DIAGNOSIS — F17210 Nicotine dependence, cigarettes, uncomplicated: Secondary | ICD-10-CM | POA: Diagnosis present

## 2018-04-15 DIAGNOSIS — K8001 Calculus of gallbladder with acute cholecystitis with obstruction: Secondary | ICD-10-CM

## 2018-04-15 LAB — CBC WITH DIFFERENTIAL/PLATELET
ABS IMMATURE GRANULOCYTES: 0 10*3/uL (ref 0.0–0.1)
BASOS ABS: 0 10*3/uL (ref 0.0–0.1)
BASOS PCT: 1 %
Eosinophils Absolute: 0.2 10*3/uL (ref 0.0–0.7)
Eosinophils Relative: 3 %
HCT: 42.6 % (ref 39.0–52.0)
HEMOGLOBIN: 13 g/dL (ref 13.0–17.0)
IMMATURE GRANULOCYTES: 0 %
LYMPHS PCT: 34 %
Lymphs Abs: 2 10*3/uL (ref 0.7–4.0)
MCH: 26.6 pg (ref 26.0–34.0)
MCHC: 30.5 g/dL (ref 30.0–36.0)
MCV: 87.1 fL (ref 78.0–100.0)
Monocytes Absolute: 0.8 10*3/uL (ref 0.1–1.0)
Monocytes Relative: 13 %
NEUTROS ABS: 3 10*3/uL (ref 1.7–7.7)
NEUTROS PCT: 49 %
PLATELETS: 255 10*3/uL (ref 150–400)
RBC: 4.89 MIL/uL (ref 4.22–5.81)
RDW: 14.4 % (ref 11.5–15.5)
WBC: 5.9 10*3/uL (ref 4.0–10.5)

## 2018-04-15 LAB — COMPREHENSIVE METABOLIC PANEL
ALBUMIN: 4 g/dL (ref 3.5–5.0)
ALT: 24 U/L (ref 17–63)
ANION GAP: 8 (ref 5–15)
AST: 27 U/L (ref 15–41)
Alkaline Phosphatase: 84 U/L (ref 38–126)
BILIRUBIN TOTAL: 1.3 mg/dL — AB (ref 0.3–1.2)
BUN: 11 mg/dL (ref 6–20)
CO2: 28 mmol/L (ref 22–32)
Calcium: 9.3 mg/dL (ref 8.9–10.3)
Chloride: 106 mmol/L (ref 101–111)
Creatinine, Ser: 0.9 mg/dL (ref 0.61–1.24)
GFR calc non Af Amer: >60 mL/min (ref >60)
Glucose, Bld: 110 mg/dL — ABNORMAL HIGH (ref 65–99)
POTASSIUM: 3.6 mmol/L (ref 3.5–5.1)
SODIUM: 142 mmol/L (ref 135–145)
TOTAL PROTEIN: 6.5 g/dL (ref 6.5–8.1)

## 2018-04-15 LAB — LIPASE, BLOOD: LIPASE: 27 U/L (ref 11–51)

## 2018-04-15 MED ORDER — ONDANSETRON 4 MG PO TBDP: 4.0000 mg | ORAL_TABLET | Freq: Four times a day (QID) | ORAL | Status: DC | PRN

## 2018-04-15 MED ORDER — SODIUM CHLORIDE 0.9 % IV SOLN
2.0000 g | INTRAVENOUS | Status: DC
  Administered 2018-04-16: 2 g via INTRAVENOUS
  Filled 2018-04-15 (×2): qty 20

## 2018-04-15 MED ORDER — SODIUM CHLORIDE 0.9 % IV BOLUS
1000.0000 mL | Freq: Once | INTRAVENOUS | Status: AC
  Administered 2018-04-15: 1000 mL via INTRAVENOUS

## 2018-04-15 MED ORDER — ONDANSETRON HCL 4 MG/2ML IJ SOLN
4.0000 mg | Freq: Once | INTRAMUSCULAR | Status: AC
  Administered 2018-04-15: 4 mg via INTRAVENOUS
  Filled 2018-04-15: qty 2

## 2018-04-15 MED ORDER — DIPHENHYDRAMINE HCL 50 MG/ML IJ SOLN
12.5000 mg | Freq: Four times a day (QID) | INTRAMUSCULAR | Status: DC | PRN
  Administered 2018-04-15 – 2018-04-16 (×2): 12.5 mg via INTRAVENOUS
  Filled 2018-04-15 (×2): qty 1

## 2018-04-15 MED ORDER — SODIUM CHLORIDE 0.9 % IV SOLN
2.0000 g | Freq: Once | INTRAVENOUS | Status: AC
  Administered 2018-04-15: 2 g via INTRAVENOUS
  Filled 2018-04-15: qty 20

## 2018-04-15 MED ORDER — ENOXAPARIN SODIUM 40 MG/0.4ML ~~LOC~~ SOLN
40.0000 mg | SUBCUTANEOUS | Status: DC
  Administered 2018-04-15 – 2018-04-16 (×2): 40 mg via SUBCUTANEOUS
  Filled 2018-04-15 (×2): qty 0.4

## 2018-04-15 MED ORDER — ACETAMINOPHEN 325 MG PO TABS
650.0000 mg | ORAL_TABLET | Freq: Four times a day (QID) | ORAL | Status: DC | PRN
  Administered 2018-04-16: 650 mg via ORAL
  Filled 2018-04-15: qty 2

## 2018-04-15 MED ORDER — HYDROMORPHONE HCL 2 MG/ML IJ SOLN
0.5000 mg | INTRAMUSCULAR | Status: DC | PRN
  Administered 2018-04-15 – 2018-04-16 (×2): 2 mg via INTRAVENOUS
  Filled 2018-04-15 (×2): qty 1

## 2018-04-15 MED ORDER — OXYCODONE HCL 5 MG PO TABS
5.0000 mg | ORAL_TABLET | ORAL | Status: DC | PRN
  Administered 2018-04-15 – 2018-04-17 (×5): 10 mg via ORAL
  Filled 2018-04-15 (×5): qty 2

## 2018-04-15 MED ORDER — MORPHINE SULFATE (PF) 4 MG/ML IV SOLN
4.0000 mg | Freq: Once | INTRAVENOUS | Status: AC
  Administered 2018-04-15: 4 mg via INTRAVENOUS
  Filled 2018-04-15: qty 1

## 2018-04-15 MED ORDER — DIPHENHYDRAMINE HCL 12.5 MG/5ML PO ELIX: 12.5000 mg | ORAL_SOLUTION | Freq: Four times a day (QID) | ORAL | Status: DC | PRN

## 2018-04-15 MED ORDER — ACETAMINOPHEN 650 MG RE SUPP: 650.0000 mg | Freq: Four times a day (QID) | RECTAL | Status: DC | PRN

## 2018-04-15 MED ORDER — KCL IN DEXTROSE-NACL 20-5-0.45 MEQ/L-%-% IV SOLN
INTRAVENOUS | Status: DC
  Administered 2018-04-15 – 2018-04-16 (×2): via INTRAVENOUS
  Filled 2018-04-15 (×2): qty 1000

## 2018-04-15 MED ORDER — SIMETHICONE 80 MG PO CHEW: 40.0000 mg | CHEWABLE_TABLET | Freq: Four times a day (QID) | ORAL | Status: DC | PRN

## 2018-04-15 MED ORDER — METHOCARBAMOL 500 MG PO TABS
500.0000 mg | ORAL_TABLET | Freq: Three times a day (TID) | ORAL | Status: DC | PRN
Start: 2018-04-15 — End: 2018-04-17
  Administered 2018-04-15 – 2018-04-16 (×3): 500 mg via ORAL
  Filled 2018-04-15 (×3): qty 1

## 2018-04-15 MED ORDER — FENTANYL CITRATE (PF) 100 MCG/2ML IJ SOLN
100.0000 ug | Freq: Once | INTRAMUSCULAR | Status: AC
  Administered 2018-04-15: 100 ug via INTRAVENOUS
  Filled 2018-04-15: qty 2

## 2018-04-15 MED ORDER — ONDANSETRON HCL 4 MG/2ML IJ SOLN: 4.0000 mg | Freq: Four times a day (QID) | INTRAMUSCULAR | Status: DC | PRN

## 2018-04-15 NOTE — ED Provider Notes (Signed)
Care assumed from Jeremiah SafeElizabeth Hammond, PA-C at shift change with consultation to general surgery pending.   In brief, this patient is a 22 y.o. male with a history of marijuana abuse who presents emergency department today for right upper quadrant abdominal pain that is been occurring over the last 1 week.  He notes that the pain typically occurs after episodes of eating and is associate with nausea as well as non-bloody, non-melanous diarrhea.  He reports his last visit pain started this morning and he currently rates his pain level is a 5/10.  No fever, chills, chest pain, flank pain, emesis, urinary symptoms. Reports episode of diarrhea prior to arrival. He is still passing flatus. Does have history of gallbladder issues. No previous abdominal surgeries. Last p.o. intake at 10 AM this morning.  PLAN: Patient with elevated bilirubin of 1.3.  This appears stable from patient's previous visit on 02/10/2016.  He did have elevated bilirubin of 2.1 yesterday (he left prior to being seen).  No leukocytosis.  LFTs otherwise unremarkable.  Lipase within normal limits.  Patient's ultrasound with a 1.9 cm stone at the neck of the gallbladder.  The gallbladder is mildly thickened.  There is no pericholecystic fluid or Murphy sign.  Patient continues to have tenderness palpation of the right upper quadrant on exam. No Murphy's sign on exam.  IV fluids, pain medication, nausea medication and abx (rocephin 2g) ordered.  I was handed off the case with consultation to surgery pending. Vitals signs are stable.   Exam: Gen: afebrile, VSS HEENT: Atraumatic, EOMI. No scleral icterus Resp: no resp distress CV: RRR Abd: Appearance normal. No erythema, jaundice or ascites. Abdomen is soft, without distension. There is TTP of the right upper quadrant and epigastrium. Negative Muprhy's sign. No rebound, rigidity or guarding. Bowel sounds are present in all four quadrants. No distension.  No McBurney's point tenderness. MsK:  moving all extremities well Neuro: A&O x4  Results for orders placed or performed during the hospital encounter of 04/15/18  Comprehensive metabolic panel  Result Value Ref Range   Sodium 142 135 - 145 mmol/L   Potassium 3.6 3.5 - 5.1 mmol/L   Chloride 106 101 - 111 mmol/L   CO2 28 22 - 32 mmol/L   Glucose, Bld 110 (H) 65 - 99 mg/dL   BUN 11 6 - 20 mg/dL   Creatinine, Ser 4.090.90 0.61 - 1.24 mg/dL   Calcium 9.3 8.9 - 81.110.3 mg/dL   Total Protein 6.5 6.5 - 8.1 g/dL   Albumin 4.0 3.5 - 5.0 g/dL   AST 27 15 - 41 U/L   ALT 24 17 - 63 U/L   Alkaline Phosphatase 84 38 - 126 U/L   Total Bilirubin 1.3 (H) 0.3 - 1.2 mg/dL   GFR calc non Af Amer >60 >60 mL/min   GFR calc Af Amer >60 >60 mL/min   Anion gap 8 5 - 15  CBC with Differential  Result Value Ref Range   WBC 5.9 4.0 - 10.5 K/uL   RBC 4.89 4.22 - 5.81 MIL/uL   Hemoglobin 13.0 13.0 - 17.0 g/dL   HCT 91.442.6 78.239.0 - 95.652.0 %   MCV 87.1 78.0 - 100.0 fL   MCH 26.6 26.0 - 34.0 pg   MCHC 30.5 30.0 - 36.0 g/dL   RDW 21.314.4 08.611.5 - 57.815.5 %   Platelets 255 150 - 400 K/uL   Neutrophils Relative % 49 %   Neutro Abs 3.0 1.7 - 7.7 K/uL   Lymphocytes Relative 34 %  Lymphs Abs 2.0 0.7 - 4.0 K/uL   Monocytes Relative 13 %   Monocytes Absolute 0.8 0.1 - 1.0 K/uL   Eosinophils Relative 3 %   Eosinophils Absolute 0.2 0.0 - 0.7 K/uL   Basophils Relative 1 %   Basophils Absolute 0.0 0.0 - 0.1 K/uL   Immature Granulocytes 0 %   Abs Immature Granulocytes 0.0 0.0 - 0.1 K/uL  Lipase, blood  Result Value Ref Range   Lipase 27 11 - 51 U/L   US Abdomen Limited  Result Date: 04/15/2018 CLINICAL DATA:  Right upper quadrant pain.  Elevated bilirubin. EXAM: ULTRASOUND ABDOMEN LIMITED RIGHT UPPER QUADRANT COMPARISON:  None. FINDINGS: Gallbladder: There is a stone measuring 1.9 cm in the neck of the gallbladder. No pericholecystic fluid or reported Murphy's sign. The gallbladder wall measures 3.3 mm. Common bile duct: Diameter: 2.4 mm Liver: No focal lesion  identified. Within normal limits in parenchymal echogenicity. Portal vein is patent on color Doppler imaging with normal direction of blood flow towards the liver. IMPRESSION: 1. There is a 1.9 cm stone in the neck of the gallbladder. The gallbladder wall is borderline to mildly thickened. No pericholecystic fluid or Murphy's sign. If the clinical picture remains ambiguous for cholecystitis, a HIDA scan could further evaluate. Electronically Signed   By: Gerome Sam III M.D   On: 04/15/2018 15:54    MDM: 22 year old male with RUQ abdominal pain on exam that occurs after eating with bilirubin of 1.3 (question if this is patient's baseline) and RUQ [place] with 1.9 cm stone in the neck of the gallbladder.  There is also borderline thickening of the gallbladder.  IV fluids, pain medication, nausea and antibiotics (Rocephin) were given prior to handoff.  Consultation to general surgery is pending.  Patient is n.p.o. with last oral intake at 10 AM this morning.   5:10 PM Spoke with Dr. Donell Beers who will admit the patient. He appears safe for admission.   1. Calculus of gallbladder with acute cholecystitis and obstruction       Jacinto Halim, PA-C 04/15/18 1859    Jacalyn Lefevre, MD 04/16/18 [phone]
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
## 2018-04-15 NOTE — ED Notes (Signed)
Called patient to a room and no answer. 

## 2018-04-15 NOTE — ED Triage Notes (Signed)
PT reports he just ate lunch at [employer].

## 2018-04-15 NOTE — ED Notes (Signed)
Called patient to room and no answer. 

## 2018-04-15 NOTE — H&P (Signed)
Jeremiah Christian is an 22 y.o. male.   Chief Complaint: RUQ pain HPI:  Pt is a 23 yo M with 1 week h/o post prandial RUQ pain.  He came to the ED yesterday and left due to it taking too long to be seen.  The pain is associated with nausea and diarrhea.  He denies diarrhea.  Labs yesterday were concerning for T bili of 2.1, but today it is 1.3.  He denies fever/chills.  He has not noted any jaundice.  He describes the pain as sharp and moderately severe.  Nothing other than the pain medicine has made the pain better.  Imaging today shows large stone lodged in gallbladder neck.  Past Medical History:  Diagnosis Date  . Gonorrhea   . Preterm infant    born 2 months early    Past Surgical History:  Procedure Laterality Date  . NECK SURGERY      Family History  Problem Relation Age of Onset  . Kidney failure Mother        ?genetic  Mother had gallbladder disease.    Social History:  reports that he has been smoking cigarettes.  He has been smoking about 0.20 packs per day. He has never used smokeless tobacco. He reports that he drinks alcohol. He reports that he used MJ.  Allergies:  Allergies  Allergen Reactions  . Banana Itching   No outpatient medications have been marked as taking for the 04/15/18 encounter [facility] Encounter).     Results for orders placed or performed during the hospital encounter of 04/15/18 (from the past 48 hour(s))  Comprehensive metabolic panel     Status: Abnormal   Collection Time: 04/15/18  1:06 PM  Result Value Ref Range   Sodium 142 135 - 145 mmol/L   Potassium 3.6 3.5 - 5.1 mmol/L   Chloride 106 101 - 111 mmol/L   CO2 28 22 - 32 mmol/L   Glucose, Bld 110 (H) 65 - 99 mg/dL   BUN 11 6 - 20 mg/dL   Creatinine, Ser 0.90 0.61 - 1.24 mg/dL   Calcium 9.3 8.9 - 10.3 mg/dL   Total Protein 6.5 6.5 - 8.1 g/dL   Albumin 4.0 3.5 - 5.0 g/dL   AST 27 15 - 41 U/L   ALT 24 17 - 63 U/L   Alkaline Phosphatase 84 38 - 126 U/L   Total Bilirubin 1.3 (H) 0.3 -  1.2 mg/dL   GFR calc non Af Amer >60 >60 mL/min   GFR calc Af Amer >60 >60 mL/min    Comment: (NOTE) The eGFR has been calculated using the CKD EPI equation. This calculation has not been validated in all clinical situations. eGFR's persistently <60 mL/min signify possible Chronic Kidney Disease.    Anion gap 8 5 - 15    Comment: Performed at [facility] [address], [place], [place] [address]  CBC with Differential     Status: None   Collection Time: 04/15/18  1:06 PM  Result Value Ref Range   WBC 5.9 4.0 - 10.5 K/uL   RBC 4.89 4.22 - 5.81 MIL/uL   Hemoglobin 13.0 13.0 - 17.0 g/dL   HCT 42.6 39.0 - 52.0 %   MCV 87.1 78.0 - 100.0 fL   MCH 26.6 26.0 - 34.0 pg   MCHC 30.5 30.0 - 36.0 g/dL   RDW 14.4 11.5 - 15.5 %   Platelets 255 150 - 400 K/uL   Neutrophils Relative % 49 %  Neutro Abs 3.0 1.7 - 7.7 K/uL   Lymphocytes Relative 34 %   Lymphs Abs 2.0 0.7 - 4.0 K/uL   Monocytes Relative 13 %   Monocytes Absolute 0.8 0.1 - 1.0 K/uL   Eosinophils Relative 3 %   Eosinophils Absolute 0.2 0.0 - 0.7 K/uL   Basophils Relative 1 %   Basophils Absolute 0.0 0.0 - 0.1 K/uL   Immature Granulocytes 0 %   Abs Immature Granulocytes 0.0 0.0 - 0.1 K/uL    Comment: Performed at [facility] [address] [place], [place] [address]  Lipase, blood     Status: None   Collection Time: 04/15/18  1:06 PM  Result Value Ref Range   Lipase 27 11 - 51 U/L    Comment: Performed at [facility], [place] [address] [place], [place] [address]   US Abdomen Limited  Result Date: 04/15/2018 CLINICAL DATA:  Right upper quadrant pain.  Elevated bilirubin. EXAM: ULTRASOUND ABDOMEN LIMITED RIGHT UPPER QUADRANT COMPARISON:  None. FINDINGS: Gallbladder: There is a stone measuring 1.9 cm in the neck of the gallbladder. No pericholecystic fluid or reported Murphy's sign. The gallbladder wall measures 3.3 mm. Common bile duct: Diameter: 2.4 mm Liver: No focal lesion identified. Within normal limits  in parenchymal echogenicity. Portal vein is patent on color Doppler imaging with normal direction of blood flow towards the liver. IMPRESSION: 1. There is a 1.9 cm stone in the neck of the gallbladder. The gallbladder wall is borderline to mildly thickened. No pericholecystic fluid or Murphy's sign. If the clinical picture remains ambiguous for cholecystitis, a HIDA scan could further evaluate. Electronically Signed   By: Dorise Bullion III M.D   On: 04/15/2018 15:27    Review of Systems  Constitutional: Negative.   HENT: Negative.   Eyes: Negative.   Respiratory: Negative.   Cardiovascular: Negative.   Gastrointestinal: Positive for diarrhea, nausea and vomiting.  Genitourinary: Negative.   Musculoskeletal: Negative.   Skin: Negative.   Neurological: Negative.   Endo/Heme/Allergies: Negative.   Psychiatric/Behavioral: Positive for substance abuse.   Blood pressure 116/67, pulse (!) 58, temperature 98.6 F (37 C), temperature source Oral, resp. rate 15, height _0  (1.676 m), weight 58.1 kg (128 lb), SpO2 94 %.   Physical Exam  Constitutional: He is oriented to person, place, and time. He appears well-developed and well-nourished. He appears distressed (looks uncomfortable).  HENT:  Head: Normocephalic and atraumatic.  Right Ear: External ear normal.  Left Ear: External ear normal.  Mouth/Throat: No oropharyngeal exudate.  Eyes: Pupils are equal, round, and reactive to light. Conjunctivae are normal. Right eye exhibits no discharge. Left eye exhibits no discharge. No scleral icterus.  Neck: Normal range of motion. Neck supple.  Cardiovascular: Normal rate and regular rhythm.  Respiratory: Effort normal and breath sounds normal. No respiratory distress. He has no wheezes. He has no rales. He exhibits no tenderness.  GI: Soft. He exhibits no distension and no mass. There is tenderness (RUQ/epigastric). There is no rebound and no guarding.  Musculoskeletal: Normal range of motion. He  exhibits no edema, tenderness or deformity.  Neurological: He is alert and oriented to person, place, and time. Coordination normal.  Skin: Skin is warm and dry. No rash noted. He is not diaphoretic. No erythema. No pallor.  Psychiatric: He has a normal mood and affect. His behavior is normal. Judgment and thought content normal.      Assessment/Plan Symptomatic cholelithiasis and early acute cholecystitis  Admit to  floor IV hydration OK for clears now. NPO after MN IV antibiotics To OR for lap chole likely tomorrow unless LFTs higher in which case will likely get MRCP.     Stark Klein, MD 04/15/2018, 5:34 PM

## 2018-04-15 NOTE — ED Triage Notes (Signed)
Pt c/o abdominal pain X 1 week reports he came last night and left to go home nad go to sleep. Reports that he has had 3 episodes of diarrhea in the last 24 hours.

## 2018-04-16 ENCOUNTER — Encounter (HOSPITAL_COMMUNITY): Admission: EM | Disposition: A | Discharge status: Home / Self Care | Payer: Self-pay | Source: Home / Self Care

## 2018-04-16 ENCOUNTER — Encounter (HOSPITAL_COMMUNITY): Payer: Self-pay

## 2018-04-16 ENCOUNTER — Inpatient Hospital Stay (HOSPITAL_COMMUNITY): Payer: Self-pay | Admitting: Anesthesiology

## 2018-04-16 HISTORY — PX: CHOLECYSTECTOMY: SHX55

## 2018-04-16 LAB — COMPREHENSIVE METABOLIC PANEL
ALT: 21 U/L (ref 17–63)
AST: 22 U/L (ref 15–41)
Albumin: 3.3 g/dL — ABNORMAL LOW (ref 3.5–5.0)
Alkaline Phosphatase: 68 U/L (ref 38–126)
Anion gap: 5 (ref 5–15)
BILIRUBIN TOTAL: 1 mg/dL (ref 0.3–1.2)
BUN: 6 mg/dL (ref 6–20)
CALCIUM: 8.5 mg/dL — AB (ref 8.9–10.3)
CO2: 29 mmol/L (ref 22–32)
CREATININE: 0.89 mg/dL (ref 0.61–1.24)
Chloride: 107 mmol/L (ref 101–111)
Glucose, Bld: 108 mg/dL — ABNORMAL HIGH (ref 65–99)
Potassium: 3.9 mmol/L (ref 3.5–5.1)
Sodium: 141 mmol/L (ref 135–145)
TOTAL PROTEIN: 5.7 g/dL — AB (ref 6.5–8.1)

## 2018-04-16 LAB — URINALYSIS, ROUTINE W REFLEX MICROSCOPIC
Bilirubin Urine: NEGATIVE
Glucose, UA: NEGATIVE mg/dL
HGB URINE DIPSTICK: NEGATIVE
Ketones, ur: NEGATIVE mg/dL
Leukocytes, UA: NEGATIVE
NITRITE: NEGATIVE
PROTEIN: NEGATIVE mg/dL
Specific Gravity, Urine: 1.014 (ref 1.005–1.030)
pH: 5 (ref 5.0–8.0)

## 2018-04-16 LAB — HIV ANTIBODY (ROUTINE TESTING W REFLEX): HIV Screen 4th Generation wRfx: NONREACTIVE

## 2018-04-16 LAB — CBC
HCT: 40.2 % (ref 39.0–52.0)
Hemoglobin: 12.3 g/dL — ABNORMAL LOW (ref 13.0–17.0)
MCH: 27.1 pg (ref 26.0–34.0)
MCHC: 30.6 g/dL (ref 30.0–36.0)
MCV: 88.5 fL (ref 78.0–100.0)
PLATELETS: 222 10*3/uL (ref 150–400)
RBC: 4.54 MIL/uL (ref 4.22–5.81)
RDW: 14.3 % (ref 11.5–15.5)
WBC: 7.2 10*3/uL (ref 4.0–10.5)

## 2018-04-16 LAB — SURGICAL PCR SCREEN
MRSA, PCR: NEGATIVE
STAPHYLOCOCCUS AUREUS: NEGATIVE

## 2018-04-16 SURGERY — LAPAROSCOPIC CHOLECYSTECTOMY WITH INTRAOPERATIVE CHOLANGIOGRAM
Anesthesia: General | Site: Abdomen

## 2018-04-16 MED ORDER — KETOROLAC TROMETHAMINE 30 MG/ML IJ SOLN
INTRAMUSCULAR | Status: AC
  Filled 2018-04-16: qty 1

## 2018-04-16 MED ORDER — SUGAMMADEX SODIUM 200 MG/2ML IV SOLN
INTRAVENOUS | Status: DC | PRN
  Administered 2018-04-16: 200 mg via INTRAVENOUS

## 2018-04-16 MED ORDER — ROCURONIUM BROMIDE 10 MG/ML (PF) SYRINGE
PREFILLED_SYRINGE | INTRAVENOUS | Status: AC
  Filled 2018-04-16: qty 5

## 2018-04-16 MED ORDER — LIDOCAINE 2% (20 MG/ML) 5 ML SYRINGE
INTRAMUSCULAR | Status: AC
  Filled 2018-04-16: qty 5

## 2018-04-16 MED ORDER — FENTANYL CITRATE (PF) 250 MCG/5ML IJ SOLN
INTRAMUSCULAR | Status: DC | PRN
  Administered 2018-04-16 (×5): 50 ug via INTRAVENOUS

## 2018-04-16 MED ORDER — ROCURONIUM BROMIDE 10 MG/ML (PF) SYRINGE
PREFILLED_SYRINGE | INTRAVENOUS | Status: DC | PRN
  Administered 2018-04-16: 10 mg via INTRAVENOUS
  Administered 2018-04-16: 40 mg via INTRAVENOUS

## 2018-04-16 MED ORDER — ONDANSETRON HCL 4 MG/2ML IJ SOLN
INTRAMUSCULAR | Status: AC
Start: 2018-04-16 — End: ?
  Filled 2018-04-16: qty 2

## 2018-04-16 MED ORDER — SUGAMMADEX SODIUM 200 MG/2ML IV SOLN
INTRAVENOUS | Status: AC
  Filled 2018-04-16: qty 2

## 2018-04-16 MED ORDER — IOPAMIDOL (ISOVUE-300) INJECTION 61%
INTRAVENOUS | Status: AC
  Filled 2018-04-16: qty 50

## 2018-04-16 MED ORDER — KETOROLAC TROMETHAMINE 30 MG/ML IJ SOLN
INTRAMUSCULAR | Status: DC | PRN
  Administered 2018-04-16: 30 mg via INTRAVENOUS

## 2018-04-16 MED ORDER — PROPOFOL 10 MG/ML IV BOLUS
INTRAVENOUS | Status: DC | PRN
  Administered 2018-04-16: 200 mg via INTRAVENOUS

## 2018-04-16 MED ORDER — BUPIVACAINE-EPINEPHRINE 0.25% -1:200000 IJ SOLN
INTRAMUSCULAR | Status: DC | PRN
  Administered 2018-04-16: 10 mL

## 2018-04-16 MED ORDER — BUPIVACAINE-EPINEPHRINE (PF) 0.25% -1:200000 IJ SOLN
INTRAMUSCULAR | Status: AC
  Filled 2018-04-16: qty 30

## 2018-04-16 MED ORDER — FENTANYL CITRATE (PF) 250 MCG/5ML IJ SOLN
INTRAMUSCULAR | Status: AC
  Filled 2018-04-16: qty 5

## 2018-04-16 MED ORDER — MIDAZOLAM HCL 2 MG/2ML IJ SOLN
INTRAMUSCULAR | Status: AC
  Filled 2018-04-16: qty 2

## 2018-04-16 MED ORDER — LACTATED RINGERS IV SOLN
INTRAVENOUS | Status: DC | PRN
  Administered 2018-04-16: 08:00:00 via INTRAVENOUS

## 2018-04-16 MED ORDER — LIDOCAINE 2% (20 MG/ML) 5 ML SYRINGE
INTRAMUSCULAR | Status: DC | PRN
  Administered 2018-04-16: 60 mg via INTRAVENOUS

## 2018-04-16 MED ORDER — MIDAZOLAM HCL 2 MG/2ML IJ SOLN
INTRAMUSCULAR | Status: DC | PRN
  Administered 2018-04-16: 2 mg via INTRAVENOUS

## 2018-04-16 MED ORDER — PROPOFOL 10 MG/ML IV BOLUS
INTRAVENOUS | Status: AC
  Filled 2018-04-16: qty 20

## 2018-04-16 MED ORDER — DEXAMETHASONE SODIUM PHOSPHATE 10 MG/ML IJ SOLN
INTRAMUSCULAR | Status: AC
  Filled 2018-04-16: qty 1

## 2018-04-16 MED ORDER — ONDANSETRON HCL 4 MG/2ML IJ SOLN
INTRAMUSCULAR | Status: DC | PRN
  Administered 2018-04-16: 4 mg via INTRAVENOUS

## 2018-04-16 MED ORDER — DEXAMETHASONE SODIUM PHOSPHATE 10 MG/ML IJ SOLN
INTRAMUSCULAR | Status: DC | PRN
  Administered 2018-04-16: 10 mg via INTRAVENOUS

## 2018-04-16 MED ORDER — PROMETHAZINE HCL 25 MG/ML IJ SOLN: 6.2500 mg | INTRAMUSCULAR | Status: DC | PRN

## 2018-04-16 MED ORDER — FENTANYL CITRATE (PF) 100 MCG/2ML IJ SOLN: 25.0000 ug | INTRAMUSCULAR | Status: DC | PRN

## 2018-04-16 MED ORDER — HYDROMORPHONE HCL 2 MG/ML IJ SOLN: 0.5000 mg | INTRAMUSCULAR | Status: DC | PRN

## 2018-04-16 MED ORDER — MUPIROCIN 2 % EX OINT
1.0000 "application " | TOPICAL_OINTMENT | Freq: Two times a day (BID) | CUTANEOUS | Status: DC
  Filled 2018-04-16: qty 22

## 2018-04-16 MED ORDER — 0.9 % SODIUM CHLORIDE (POUR BTL) OPTIME
TOPICAL | Status: DC | PRN
  Administered 2018-04-16: 1000 mL

## 2018-04-16 SURGICAL SUPPLY — 46 items
ADH SKN CLS APL DERMABOND .7 (GAUZE/BANDAGES/DRESSINGS) ×1
APPLIER CLIP 5 13 M/L LIGAMAX5 (MISCELLANEOUS) ×3
APR CLP MED LRG 5 ANG JAW (MISCELLANEOUS) ×1
BAG SPEC RTRVL 10 TROC 200 (ENDOMECHANICALS) ×1
BLADE CLIPPER SURG (BLADE) IMPLANT
CANISTER SUCT 3000ML PPV (MISCELLANEOUS) ×3 IMPLANT
CHLORAPREP W/TINT 26ML (MISCELLANEOUS) ×3 IMPLANT
CLIP APPLIE 5 13 M/L LIGAMAX5 (MISCELLANEOUS) ×1 IMPLANT
COVER MAYO STAND STRL (DRAPES) ×3 IMPLANT
COVER SURGICAL LIGHT HANDLE (MISCELLANEOUS) ×3 IMPLANT
DERMABOND ADVANCED (GAUZE/BANDAGES/DRESSINGS) ×2
DERMABOND ADVANCED .7 DNX12 (GAUZE/BANDAGES/DRESSINGS) ×1 IMPLANT
DRAPE C-ARM 42X72 X-RAY (DRAPES) ×3 IMPLANT
ELECT REM PT RETURN 9FT ADLT (ELECTROSURGICAL) ×3
ELECTRODE REM PT RTRN 9FT ADLT (ELECTROSURGICAL) ×1 IMPLANT
FILTER SMOKE EVAC LAPAROSHD (FILTER) IMPLANT
GLOVE BIO SURGEON STRL SZ8 (GLOVE) ×3 IMPLANT
GLOVE BIOGEL PI IND STRL 8 (GLOVE) ×1 IMPLANT
GLOVE BIOGEL PI INDICATOR 8 (GLOVE) ×2
GOWN STRL REUS W/ TWL LRG LVL3 (GOWN DISPOSABLE) ×2 IMPLANT
GOWN STRL REUS W/ TWL XL LVL3 (GOWN DISPOSABLE) ×1 IMPLANT
GOWN STRL REUS W/TWL LRG LVL3 (GOWN DISPOSABLE) ×6
GOWN STRL REUS W/TWL XL LVL3 (GOWN DISPOSABLE) ×3
KIT BASIN OR (CUSTOM PROCEDURE TRAY) ×3 IMPLANT
KIT TURNOVER KIT B (KITS) ×3 IMPLANT
L-HOOK LAP DISP 36CM (ELECTROSURGICAL) ×3
LHOOK LAP DISP 36CM (ELECTROSURGICAL) ×1 IMPLANT
NEEDLE 22X1 1/2 (OR ONLY) (NEEDLE) ×3 IMPLANT
NS IRRIG 1000ML POUR BTL (IV SOLUTION) ×3 IMPLANT
PAD ARMBOARD 7.5X6 YLW CONV (MISCELLANEOUS) ×3 IMPLANT
PENCIL BUTTON HOLSTER BLD 10FT (ELECTRODE) ×3 IMPLANT
POUCH RETRIEVAL ECOSAC 10 (ENDOMECHANICALS) ×1 IMPLANT
POUCH RETRIEVAL ECOSAC 10MM (ENDOMECHANICALS) ×2
SCISSORS LAP 5X35 DISP (ENDOMECHANICALS) ×3 IMPLANT
SET CHOLANGIOGRAPH 5 50 .035 (SET/KITS/TRAYS/PACK) ×3 IMPLANT
SET IRRIG TUBING LAPAROSCOPIC (IRRIGATION / IRRIGATOR) ×3 IMPLANT
SLEEVE ENDOPATH XCEL 5M (ENDOMECHANICALS) ×6 IMPLANT
SPECIMEN JAR SMALL (MISCELLANEOUS) ×3 IMPLANT
SUT VIC AB 4-0 PS2 27 (SUTURE) ×3 IMPLANT
TOWEL OR 17X24 6PK STRL BLUE (TOWEL DISPOSABLE) ×3 IMPLANT
TOWEL OR 17X26 10 PK STRL BLUE (TOWEL DISPOSABLE) ×3 IMPLANT
TRAY LAPAROSCOPIC MC (CUSTOM PROCEDURE TRAY) ×3 IMPLANT
TROCAR XCEL BLUNT TIP 100MML (ENDOMECHANICALS) ×3 IMPLANT
TROCAR XCEL NON-BLD 5MMX100MML (ENDOMECHANICALS) ×3 IMPLANT
TUBING INSUFFLATION (TUBING) ×3 IMPLANT
WATER STERILE IRR 1000ML POUR (IV SOLUTION) ×3 IMPLANT

## 2018-04-16 NOTE — Progress Notes (Signed)
Day of Surgery   Subjective/Chief Complaint: Less RUQ pain   Objective: Vital signs in last 24 hours: Temp:  [97.4 F (36.3 C)-98.6 F (37 C)] 97.4 F (36.3 C) (06/02 0512) Pulse Rate:  [46-68] 46 (06/02 0512) Resp:  [15-18] 17 (06/02 0512) BP: (109-132)/(67-74) 109/74 (06/02 0512) SpO2:  [94 %-99 %] 98 % (06/02 0512) Weight:  [58.1 kg (128 lb)-59.9 kg (132 lb 0.9 oz)] 59.9 kg (132 lb 0.9 oz) (06/01 2017) Last BM Date: 04/14/18  Intake/Output from previous day: 06/01 0701 - 06/02 0700 In: 3123.3 [P.O.:120; I.V.:903.3; IV Piggyback:2100] Out: -  Intake/Output this shift: No intake/output data recorded.  General appearance: alert and cooperative Resp: clear to auscultation bilaterally Cardio: regular rate and rhythm GI: soft, mild TTP RUQ  Lab Results:  Recent Labs    04/15/18 1306 04/16/18 0247  WBC 5.9 7.2  HGB 13.0 12.3*  HCT 42.6 40.2  PLT 255 222   BMET Recent Labs    04/15/18 1306 04/16/18 0247  NA 142 141  K 3.6 3.9  CL 106 107  CO2 28 29  GLUCOSE 110* 108*  BUN 11 6  CREATININE 0.90 0.89  CALCIUM 9.3 8.5*   PT/INR No results for input(s): LABPROT, INR in the last 72 hours. ABG No results for input(s): PHART, HCO3 in the last 72 hours.  Invalid input(s): PCO2, PO2  Studies/Results: [place] Abdomen Limited  Result Date: 04/15/2018 CLINICAL DATA:  Right upper quadrant pain.  Elevated bilirubin. EXAM: ULTRASOUND ABDOMEN LIMITED RIGHT UPPER QUADRANT COMPARISON:  None. FINDINGS: Gallbladder: There is a stone measuring 1.9 cm in the neck of the gallbladder. No pericholecystic fluid or reported Murphy's sign. The gallbladder wall measures 3.3 mm. Common bile duct: Diameter: 2.4 mm Liver: No focal lesion identified. Within normal limits in parenchymal echogenicity. Portal vein is patent on color Doppler imaging with normal direction of blood flow towards the liver. IMPRESSION: 1. There is a 1.9 cm stone in the neck of the gallbladder. The gallbladder wall is  borderline to mildly thickened. No pericholecystic fluid or Murphy's sign. If the clinical picture remains ambiguous for cholecystitis, a HIDA scan could further evaluate. Electronically Signed   By: Gerome Samavid  Williams III M.D   On: 04/15/2018 15:27    Anti-infectives: Anti-infectives (From admission, onward)   Start     Dose/Rate Route Frequency Ordered Stop   04/16/18 1200  [MAR Hold]  cefTRIAXone (ROCEPHIN) 2 g in sodium chloride 0.9 % 100 mL IVPB     (MAR Hold since Sun 04/16/2018 at 0801. Reason: Transfer to a Procedural area.)   2 g 200 mL/hr over 30 Minutes Intravenous Every 24 hours 04/15/18 2014     04/15/18 1545  cefTRIAXone (ROCEPHIN) 2 g in sodium chloride 0.9 % 100 mL IVPB     2 g 200 mL/hr over 30 Minutes Intravenous  Once 04/15/18 1545 04/15/18 1903      Assessment/Plan: Cholecystitis with cholelithiasis. For lap chole, IOC. Procedure, risks, and benefits discussed and he agrees. Patient examined and I agree with the assessment and plan  Violeta GelinasBurke Karli Wickizer, MD, MPH, FACS Trauma: [phone] General Surgery: [phone]  04/16/2018 8:10 AM   LOS: 1 day    Liz MaladyBurke E Jeniah Kishi 04/16/2018

## 2018-04-16 NOTE — Op Note (Signed)
04/16/2018  9:29 AM  PATIENT:  Jeremiah Christian  22 y.o. male  PRE-OPERATIVE DIAGNOSIS:  Cholecystitis with cholelithiasis  POST-OPERATIVE DIAGNOSIS:  Cholecystitis with cholelithiasis  PROCEDURE:  Procedure(s): LAPAROSCOPIC CHOLECYSTECTOMY   SURGEON:  Surgeon(s): Violeta Gelinashompson, Wyoma Genson, MD  ASSISTANTS: none   ANESTHESIA:   local and general  EBL:  Total I/O In: 500 [I.V.:500] Out: 25 [Blood:25]  BLOOD ADMINISTERED:none  DRAINS: none   SPECIMEN:  Excision  DISPOSITION OF SPECIMEN:  PATHOLOGY  COUNTS:  YES  DICTATION: .Dragon Dictation Findings: Acute cholecystitis with cholelithiasis  Procedure in detail: [name] is brought for cholecystectomy.  Informed consent was obtained.  He received intravenous antibiotics.  He was brought to the operating room and general endotracheal anesthesia was administered by the anesthesia staff.  His abdomen was prepped and draped in sterile fashion.  We did a timeout procedure.The infraumbilical region was infiltrated with local. Infraumbilical incision was made. Subcutaneous tissues were dissected down revealing the anterior fascia. This was divided sharply along the midline. Peritoneal cavity was entered under direct vision without complication. A 0 Vicryl pursestring was placed around the fascial opening. Hassan trocar was inserted into the abdomen. The abdomen was insufflated with carbon dioxide in standard fashion. Under direct vision a 5 on the epigastric and two 5 mm right lateral ports were placed.  Local was used at each port site.  Laparoscopic expiration revealed acute inflammation of the gallbladder with a large stone in the infundibulum.  The dome of the gallbladder was retracted superior medially.  The infundibulum was retracted inferior laterally.  Dissection began laterally and progressed medially identifying the cystic duct and an anterior branch of the cystic artery.  The cystic duct was dissected until we had a critical view of safety.   Liver function tests are normal.  I did not do a cholangiogram.  3 clips were placed proximally on the cystic duct, one was placed distally that was divided.  2 clips were placed proximally in the anterior branch of the cystic artery and it was divided distally with cautery.  The gallbladder was taken off the liver bed using cautery.  We did encounter a posterior branch of the cystic artery which was clipped twice proximally and divided distally with cautery.  The gallbladder was taken the rest of the way off the golf dilator bed using cautery and achieving excellent hemostasis.  The gallbladder was placed in a bag and removed from the abdomen.  It was sent to pathology.  The liver bed was cauterized to get excellent hemostasis.  The area was copiously irrigated.  Irrigation fluid returned clear.  Ports removed under direct vision.  Pneumoperitoneum was released.  Infraumbilical fascia was closed by tying the pursestring.  All 4 wounds were irrigated and the skin of each was closed with running 4-0 Vicryl followed by Dermabond.  All counts were correct.  He tolerated the procedure well without apparent complication and was taken recovery in stable condition.  PATIENT DISPOSITION:  PACU - hemodynamically stable.   Delay start of Pharmacological VTE agent (>24hrs) due to surgical blood loss or risk of bleeding:  no  Violeta GelinasBurke Lillybeth Tal, MD, MPH, FACS Pager: [phone]  6/2/20199:29 AM

## 2018-04-16 NOTE — Transfer of Care (Signed)
Immediate Anesthesia Transfer of Care Note  Patient: Jeremiah Christian  Procedure(s) Performed: LAPAROSCOPIC CHOLECYSTECTOMY WITH INTRAOPERATIVE CHOLANGIOGRAM (N/A Abdomen)  Patient Location: PACU  Anesthesia Type:General  Level of Consciousness: awake, alert  and oriented  Airway & Oxygen Therapy: Patient Spontanous Breathing  Post-op Assessment: Report given to RN and Post -op Vital signs reviewed and stable  Post vital signs: Reviewed and stable  Last Vitals:  Vitals Value Taken Time  BP    Temp    Pulse 73 04/16/2018  9:47 AM  Resp 13 04/16/2018  9:47 AM  SpO2 97 % 04/16/2018  9:47 AM  Vitals shown include unvalidated device data.  Last Pain:  Vitals:   04/16/18 0512  TempSrc: Oral  PainSc:       Patients Stated Pain Goal: 4 (04/15/18 1941)  Complications: No apparent anesthesia complications

## 2018-04-16 NOTE — Progress Notes (Signed)
Pt refused to get another IV at this time, educated.

## 2018-04-16 NOTE — Anesthesia Preprocedure Evaluation (Signed)
Anesthesia Evaluation  Patient identified by MRN, date of birth, ID band Patient awake    Reviewed: Allergy & Precautions, NPO status , Patient's Chart, lab work & pertinent test results  Airway Mallampati: II  TM Distance: >3 FB Neck ROM: Full    Dental  (+) Dental Advisory Given   Pulmonary Current Smoker,    breath sounds clear to auscultation       Cardiovascular negative cardio ROS   Rhythm:Regular Rate:Normal     Neuro/Psych negative neurological ROS     GI/Hepatic negative GI ROS, Neg liver ROS,   Endo/Other  negative endocrine ROS  Renal/GU negative Renal ROS     Musculoskeletal   Abdominal   Peds  Hematology negative hematology ROS (+)   Anesthesia Other Findings   Reproductive/Obstetrics                             Lab Results  Component Value Date   WBC 7.2 04/16/2018   HGB 12.3 (L) 04/16/2018   HCT 40.2 04/16/2018   MCV 88.5 04/16/2018   PLT 222 04/16/2018   Lab Results  Component Value Date   CREATININE 0.89 04/16/2018   BUN 6 04/16/2018   NA 141 04/16/2018   K 3.9 04/16/2018   CL 107 04/16/2018   CO2 29 04/16/2018    Anesthesia Physical Anesthesia Plan  ASA: I  Anesthesia Plan: General   Post-op Pain Management:    Induction: Intravenous  PONV Risk Score and Plan: 2 and Ondansetron, Dexamethasone and Treatment may vary due to age or medical condition  Airway Management Planned: Oral ETT  Additional Equipment:   Intra-op Plan:   Post-operative Plan: Extubation in OR  Informed Consent: I have reviewed the patients History and Physical, chart, labs and discussed the procedure including the risks, benefits and alternatives for the proposed anesthesia with the patient or authorized representative who has indicated his/her understanding and acceptance.   Dental advisory given  Plan Discussed with: CRNA  Anesthesia Plan Comments:          Anesthesia Quick Evaluation

## 2018-04-16 NOTE — Anesthesia Postprocedure Evaluation (Signed)
Anesthesia Post Note  Patient: Jeremiah Christian  Procedure(s) Performed: LAPAROSCOPIC CHOLECYSTECTOMY WITH INTRAOPERATIVE CHOLANGIOGRAM (N/A Abdomen)     Patient location during evaluation: PACU Anesthesia Type: General Level of consciousness: awake and alert Pain management: pain level controlled Vital Signs Assessment: post-procedure vital signs reviewed and stable Respiratory status: spontaneous breathing, nonlabored ventilation, respiratory function stable and patient connected to nasal cannula oxygen Cardiovascular status: blood pressure returned to baseline and stable Postop Assessment: no apparent nausea or vomiting Anesthetic complications: no    Last Vitals:  Vitals:   04/16/18 1013 04/16/18 1024  BP: 125/83 118/72  Pulse: (!) 54 (!) 56  Resp: 10   Temp: 36.5 C (!) 36.3 C  SpO2: 99% 99%    Last Pain:  Vitals:   04/16/18 1036  TempSrc:   PainSc: 9                  Kennieth RadFitzgerald, Duana Benedict E

## 2018-04-16 NOTE — Anesthesia Procedure Notes (Signed)
Procedure Name: Intubation Date/Time: 04/16/2018 8:38 AM Performed by: Clearnce Sorrel, CRNA Pre-anesthesia Checklist: Patient identified, Emergency Drugs available, Suction available, Patient being monitored and Timeout performed Patient Re-evaluated:Patient Re-evaluated prior to induction Oxygen Delivery Method: Circle system utilized Preoxygenation: Pre-oxygenation with 100% oxygen Induction Type: IV induction Ventilation: Mask ventilation without difficulty Laryngoscope Size: Mac and 3 Grade View: Grade I Tube type: Oral Tube size: 7.5 mm Number of attempts: 1 Airway Equipment and Method: Stylet Placement Confirmation: ETT inserted through vocal cords under direct vision,  positive ETCO2 and breath sounds checked- equal and bilateral Secured at: 23 cm Tube secured with: Tape Dental Injury: Teeth and Oropharynx as per pre-operative assessment

## 2018-04-17 ENCOUNTER — Encounter (HOSPITAL_COMMUNITY): Payer: Self-pay | Admitting: General Surgery

## 2018-04-17 MED ORDER — OXYCODONE HCL 5 MG PO TABS: 5.0000 mg | ORAL_TABLET | ORAL | 0 refills | Status: DC | PRN

## 2018-04-17 MED ORDER — ACETAMINOPHEN 325 MG PO TABS: 650.0000 mg | ORAL_TABLET | ORAL | Status: DC | PRN

## 2018-04-17 NOTE — Discharge Instructions (Signed)
please arrive at least 30 min before your appointment to complete your check in paperwork.  If you are unable to arrive 30 min prior to your appointment time we may have to cancel or reschedule you. ° °LAPAROSCOPIC SURGERY: POST OP INSTRUCTIONS  °1. DIET: Follow a light bland diet the first 24 hours after arrival home, such as soup, liquids, crackers, etc. Be sure to include lots of fluids daily. Avoid fast food or heavy meals as your are more likely to get nauseated. Eat a low fat the next few days after surgery.  °2. Take your usually prescribed home medications unless otherwise directed. °3. PAIN CONTROL:  °1. Pain is best controlled by a usual combination of three different methods TOGETHER:  °i. Ice/Heat °ii. Over the counter pain medication °iii. Prescription pain medication °2. Most patients will experience some swelling and bruising around the incisions. Ice packs or heating pads (30-60 minutes up to 6 times a day) will help. Use ice for the first few days to help decrease swelling and bruising, then switch to heat to help relax tight/sore spots and speed recovery. Some people prefer to use ice alone, heat alone, alternating between ice & heat. Experiment to what works for you. Swelling and bruising can take several weeks to resolve.  °3. It is helpful to take an over-the-counter pain medication regularly for the first few weeks. Choose one of the following that works best for you:  °i. Naproxen (Aleve, etc) Two 220mg tabs twice a day °ii. Ibuprofen (Advil, etc) Three 200mg tabs four times a day (every meal & bedtime) °iii. Acetaminophen (Tylenol, etc) 500-650mg four times a day (every meal & bedtime) °4. A prescription for pain medication (such as oxycodone, hydrocodone, etc) should be given to you upon discharge. Take your pain medication as prescribed.  °i. If you are having problems/concerns with the prescription medicine (does not control pain, nausea, vomiting, rash, itching, etc), please call us [phone]  [phone] to see if we need to switch you to a different pain medicine that will work better for you and/or control your side effect better. °ii. If you need a refill on your pain medication, please contact your pharmacy. They will contact our office to request authorization. Prescriptions will not be filled after 5 pm or on week-ends. °1. Avoid getting constipated. Between the surgery and the pain medications, it is common to experience some constipation. Increasing fluid intake and taking a fiber supplement (such as Metamucil, Citrucel, FiberCon, MiraLax, etc) 1-2 times a day regularly will usually help prevent this problem from occurring. A mild laxative (prune juice, Milk of Magnesia, MiraLax, etc) should be taken according to package directions if there are no bowel movements after 48 hours.  °2. Watch out for diarrhea. If you have many loose bowel movements, simplify your diet to bland foods & liquids for a few days. Stop any stool softeners and decrease your fiber supplement. Switching to mild anti-diarrheal medications (Kayopectate, Pepto Bismol) can help. If this worsens or does not improve, please call us. °3. Wash / shower every day. You may shower over the dressings as they are waterproof. Continue to shower over incision(s) after the dressing is off. °4. Remove your waterproof bandages 5 days after surgery. You may leave the incision open to air. You may replace a dressing/Band-Aid to cover the incision for comfort if you wish.  °5. ACTIVITIES as tolerated:  °a. You may resume regular (light) daily activities beginning the next day--such as daily self-care, walking, climbing stairs--gradually   increasing activities as tolerated. If you can walk 30 minutes without difficulty, it is safe to try more intense activity such as jogging, treadmill, bicycling, low-impact aerobics, swimming, etc. °b. Save the most intensive and strenuous activity for last such as sit-ups, heavy lifting, contact sports, etc Refrain  from any heavy lifting or straining until you are off narcotics for pain control.  °c. DO NOT PUSH THROUGH PAIN. Let pain be your guide: If it hurts to do something, don't do it. Pain is your body warning you to avoid that activity for another week until the pain goes down. °d. You may drive when you are no longer taking prescription pain medication, you can comfortably wear a seatbelt, and you can safely maneuver your car and apply brakes. °e. You may have sexual intercourse when it is comfortable.  °6. FOLLOW UP in our office  °a. Please call [facility] at [phone] to set up an appointment to see your surgeon in the office for a follow-up appointment approximately 2-3 weeks after your surgery. °b. Make sure that you call for this appointment the day you arrive home to insure a convenient appointment time. °     10. IF YOU HAVE DISABILITY OR FAMILY LEAVE FORMS, BRING THEM TO THE               OFFICE FOR PROCESSING.  ° °WHEN TO CALL US [phone]:  °1. Poor pain control °2. Reactions / problems with new medications (rash/itching, nausea, etc)  °3. Fever over 101.5 F (38.5 C) °4. Inability to urinate °5. Nausea and/or vomiting °6. Worsening swelling or bruising °7. Continued bleeding from incision. °8. Increased pain, redness, or drainage from the incision ° °The clinic staff is available to answer your questions during regular business hours (8:30am-5pm). Please don’t hesitate to call and ask to speak to one of our nurses for clinical concerns.  °If you have a medical emergency, go to the nearest emergency room or call 911.  °A surgeon from [facility] is always on call at the hospitals  ° °[facility]  °[address], [address], [place],  [address] ?  °MAIN: [phone] ? TOLL FREE: [phone] ?  °FAX [phone]  °[email or web address] ° °
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
## 2018-04-17 NOTE — Care Management Note (Signed)
Case Management Note  Patient Details  Name: Tillie FantasiaKaraun N Farrugia MRN: [id number] Date of Birth: 02/02/1996  Subjective/Objective:                    Action/Plan: MATCH letter and [facility] information given. Patient voiced understanding  Expected Discharge Date:  04/17/18               Expected Discharge Plan:  Home/Self Care  In-House Referral:  Financial Counselor  Discharge planning Services  CM Consult, [facility], Med[facility] Assistance  Post Acute Care Choice:  NA Choice offered to:  Patient  DME Arranged:  N/A DME Agency:  NA  HH Arranged:  NA HH Agency:  NA  Status of Service:  Completed, signed off  If discussed at Long Length of Stay Meetings, dates discussed:    Additional Comments:  Kingsley PlanWile, Kona Lover Marie, RN 04/17/2018, 10:16 AM

## 2018-04-17 NOTE — Care Management Note (Signed)
Case Management Note  Patient Details  Name: Jeremiah Christian MRN: [id number] Date of Birth: 03/14/1996  Subjective/Objective:                    Action/Plan:   Expected Discharge Date:  04/17/18               Expected Discharge Plan:  Home/Self Care  In-House Referral:  Financial Counselor  Discharge planning Services  CM Consult  Post Acute Care Choice:  NA Choice offered to:  Patient  DME Arranged:  N/A DME Agency:  NA  HH Arranged:  NA HH Agency:  NA  Status of Service:  Completed, signed off  If discussed at Long Length of Stay Meetings, dates discussed:    Additional Comments:  Kingsley PlanWile, Jeremiah Zaragoza Marie, RN 04/17/2018, 10:15 AM

## 2018-07-13 ENCOUNTER — Encounter (HOSPITAL_COMMUNITY): Payer: Self-pay | Admitting: Emergency Medicine

## 2018-07-13 ENCOUNTER — Emergency Department (HOSPITAL_COMMUNITY)
Admission: EM | Admit: 2018-07-13 | Discharge: 2018-07-13 | Disposition: A | Discharge status: Home / Self Care | Payer: Self-pay | Attending: Emergency Medicine | Admitting: Emergency Medicine

## 2018-07-13 ENCOUNTER — Emergency Department (HOSPITAL_COMMUNITY): Payer: Self-pay

## 2018-07-13 DIAGNOSIS — R112 Nausea with vomiting, unspecified: Secondary | ICD-10-CM | POA: Insufficient documentation

## 2018-07-13 DIAGNOSIS — R1010 Upper abdominal pain, unspecified: Secondary | ICD-10-CM | POA: Insufficient documentation

## 2018-07-13 DIAGNOSIS — R059 Cough, unspecified: Secondary | ICD-10-CM

## 2018-07-13 DIAGNOSIS — R05 Cough: Secondary | ICD-10-CM | POA: Insufficient documentation

## 2018-07-13 DIAGNOSIS — F17210 Nicotine dependence, cigarettes, uncomplicated: Secondary | ICD-10-CM | POA: Insufficient documentation

## 2018-07-13 LAB — COMPREHENSIVE METABOLIC PANEL
ALBUMIN: 4 g/dL (ref 3.5–5.0)
ALK PHOS: 72 U/L (ref 38–126)
ALT: 20 U/L (ref 0–44)
AST: 33 U/L (ref 15–41)
Anion gap: 11 (ref 5–15)
BUN: 8 mg/dL (ref 6–20)
CALCIUM: 9 mg/dL (ref 8.9–10.3)
CO2: 21 mmol/L — AB (ref 22–32)
CREATININE: 0.98 mg/dL (ref 0.61–1.24)
Chloride: 108 mmol/L (ref 98–111)
GFR calc non Af Amer: >60 mL/min (ref >60)
GLUCOSE: 84 mg/dL (ref 70–99)
Potassium: 3.3 mmol/L — ABNORMAL LOW (ref 3.5–5.1)
Sodium: 140 mmol/L (ref 135–145)
Total Bilirubin: 1.4 mg/dL — ABNORMAL HIGH (ref 0.3–1.2)
Total Protein: 6.5 g/dL (ref 6.5–8.1)

## 2018-07-13 LAB — URINALYSIS, ROUTINE W REFLEX MICROSCOPIC
BILIRUBIN URINE: NEGATIVE
Glucose, UA: NEGATIVE mg/dL
HGB URINE DIPSTICK: NEGATIVE
Ketones, ur: NEGATIVE mg/dL
Leukocytes, UA: NEGATIVE
Nitrite: NEGATIVE
PROTEIN: NEGATIVE mg/dL
Specific Gravity, Urine: 1.017 (ref 1.005–1.030)
pH: 5 (ref 5.0–8.0)

## 2018-07-13 LAB — CBC
HCT: 40.2 % (ref 39.0–52.0)
Hemoglobin: 12.3 g/dL — ABNORMAL LOW (ref 13.0–17.0)
MCH: 27.5 pg (ref 26.0–34.0)
MCHC: 30.6 g/dL (ref 30.0–36.0)
MCV: 89.9 fL (ref 78.0–100.0)
PLATELETS: 246 10*3/uL (ref 150–400)
RBC: 4.47 MIL/uL (ref 4.22–5.81)
RDW: 13.5 % (ref 11.5–15.5)
WBC: 5.8 10*3/uL (ref 4.0–10.5)

## 2018-07-13 LAB — LIPASE, BLOOD: Lipase: 32 U/L (ref 11–51)

## 2018-07-13 MED ORDER — FAMOTIDINE 20 MG PO TABS: 20.0000 mg | ORAL_TABLET | Freq: Two times a day (BID) | ORAL | 0 refills | Status: DC

## 2018-07-13 MED ORDER — ONDANSETRON 4 MG PO TBDP: 4.0000 mg | ORAL_TABLET | Freq: Three times a day (TID) | ORAL | 0 refills | Status: DC | PRN

## 2018-07-13 MED ORDER — ONDANSETRON 4 MG PO TBDP
4.0000 mg | ORAL_TABLET | Freq: Once | ORAL | Status: AC | PRN
  Administered 2018-07-13: 4 mg via ORAL
  Filled 2018-07-13: qty 1

## 2018-07-13 NOTE — ED Notes (Signed)
Pt alert and oriented in NAD. Pt verbalized understanding of discharge instructions. 

## 2018-07-13 NOTE — ED Notes (Signed)
Patient transported to X-ray 
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
## 2018-07-13 NOTE — ED Triage Notes (Signed)
Pt presents to ED for assessment of a tickle in the back of his throat, cough which becomes so extreme he vomits.  Pt also c/o intermittent nausea, and emesis not related to cough.  Patient states recent gallbladder surgery and he notes symptoms after fatty meals ("which are most of what I eat").

## 2018-07-13 NOTE — Discharge Instructions (Signed)
Please read and follow all provided instructions.  Your diagnoses today include:  1. Non-intractable vomiting with nausea, unspecified vomiting type   2. Cough     Tests performed today include: Blood counts and electrolytes Blood tests to check liver and kidney function Blood tests to check pancreas function Urine test to look for infection Chest x-ray - no pneumonia Vital signs. See below for your results today.   Medications prescribed:  Pepcid (famotidine) - antihistamine  You can find this medication over-the-counter.   DO NOT exceed:  20mg  Pepcid every 12 hours  Zofran (ondansetron) - for nausea and vomiting  Take any prescribed medications only as directed.  Home care instructions:  Follow any educational materials contained in this packet.  Follow-up instructions: Please follow-up with your primary care provider in the next 3 days for further evaluation of your symptoms.    Return instructions:  SEEK IMMEDIATE MEDICAL ATTENTION IF: The pain does not go away or becomes severe  A temperature above 101F develops  Repeated vomiting occurs (multiple episodes)  The pain becomes localized to portions of the abdomen. The right side could possibly be appendicitis. In an adult, the left lower portion of the abdomen could be colitis or diverticulitis.  Blood is being passed in stools or vomit (bright red or black tarry stools)  You develop chest pain, difficulty breathing, dizziness or fainting, or become confused, poorly responsive, or inconsolable (young children) If you have any other emergent concerns regarding your health  Additional Information: Abdominal (belly) pain can be caused by many things. Your caregiver performed an examination and possibly ordered blood/urine tests and imaging (CT scan, x-rays, ultrasound). Many cases can be observed and treated at home after initial evaluation in the emergency department. Even though you are being discharged home, abdominal  pain can be unpredictable. Therefore, you need a repeated exam if your pain does not resolve, returns, or worsens. Most patients with abdominal pain don't have to be admitted to the hospital or have surgery, but serious problems like appendicitis and gallbladder attacks can start out as nonspecific pain. Many abdominal conditions cannot be diagnosed in one visit, so follow-up evaluations are very important.  Your vital signs today were: BP 113/78 (BP Location: Right Arm)    Pulse 66    Temp 98.5 F (36.9 C) (Oral)    SpO2 97%  If your blood pressure (bp) was elevated above 135/85 this visit, please have this repeated by your doctor within one month. --------------

## 2018-11-06 ENCOUNTER — Encounter (HOSPITAL_COMMUNITY): Payer: Self-pay | Admitting: Behavioral Health

## 2018-11-06 ENCOUNTER — Other Ambulatory Visit: Payer: Self-pay

## 2018-11-06 ENCOUNTER — Emergency Department (HOSPITAL_COMMUNITY)
Admission: EM | Admit: 2018-11-06 | Discharge: 2018-11-07 | Payer: Medicaid Other | Attending: Emergency Medicine | Admitting: Emergency Medicine

## 2018-11-06 DIAGNOSIS — Z046 Encounter for general psychiatric examination, requested by authority: Secondary | ICD-10-CM | POA: Insufficient documentation

## 2018-11-06 DIAGNOSIS — Z79899 Other long term (current) drug therapy: Secondary | ICD-10-CM | POA: Insufficient documentation

## 2018-11-06 DIAGNOSIS — F1210 Cannabis abuse, uncomplicated: Secondary | ICD-10-CM

## 2018-11-06 DIAGNOSIS — F12959 Cannabis use, unspecified with psychotic disorder, unspecified: Secondary | ICD-10-CM | POA: Insufficient documentation

## 2018-11-06 DIAGNOSIS — F17210 Nicotine dependence, cigarettes, uncomplicated: Secondary | ICD-10-CM | POA: Insufficient documentation

## 2018-11-06 LAB — COMPREHENSIVE METABOLIC PANEL
ALK PHOS: 74 U/L (ref 38–126)
ALT: 30 U/L (ref 0–44)
AST: 58 U/L — ABNORMAL HIGH (ref 15–41)
Albumin: 4.6 g/dL (ref 3.5–5.0)
Anion gap: 11 (ref 5–15)
BILIRUBIN TOTAL: 0.8 mg/dL (ref 0.3–1.2)
BUN: 13 mg/dL (ref 6–20)
CALCIUM: 8.9 mg/dL (ref 8.9–10.3)
CO2: 23 mmol/L (ref 22–32)
CREATININE: 0.72 mg/dL (ref 0.61–1.24)
Chloride: 104 mmol/L (ref 98–111)
GFR calc non Af Amer: >60 mL/min (ref >60)
Glucose, Bld: 94 mg/dL (ref 70–99)
Potassium: 3.2 mmol/L — ABNORMAL LOW (ref 3.5–5.1)
SODIUM: 138 mmol/L (ref 135–145)
TOTAL PROTEIN: 7 g/dL (ref 6.5–8.1)

## 2018-11-06 LAB — CBC
HEMATOCRIT: 37.9 % — AB (ref 39.0–52.0)
Hemoglobin: 11.6 g/dL — ABNORMAL LOW (ref 13.0–17.0)
MCH: 27.5 pg (ref 26.0–34.0)
MCHC: 30.6 g/dL (ref 30.0–36.0)
MCV: 89.8 fL (ref 80.0–100.0)
Platelets: 219 10*3/uL (ref 150–400)
RBC: 4.22 MIL/uL (ref 4.22–5.81)
RDW: 13.2 % (ref 11.5–15.5)
WBC: 7.9 10*3/uL (ref 4.0–10.5)
nRBC: 0 % (ref 0.0–0.2)

## 2018-11-06 LAB — RAPID URINE DRUG SCREEN, HOSP PERFORMED
Amphetamines: NOT DETECTED
BARBITURATES: NOT DETECTED
Benzodiazepines: NOT DETECTED
COCAINE: NOT DETECTED
Opiates: NOT DETECTED
Tetrahydrocannabinol: POSITIVE — AB

## 2018-11-06 LAB — ETHANOL: Alcohol, Ethyl (B): <10 mg/dL (ref <10)

## 2018-11-06 MED ORDER — HALOPERIDOL 5 MG PO TABS
5.0000 mg | ORAL_TABLET | Freq: Two times a day (BID) | ORAL | Status: DC
  Administered 2018-11-07: 5 mg via ORAL
  Filled 2018-11-06 (×2): qty 1

## 2018-11-06 MED ORDER — HALOPERIDOL 5 MG PO TABS
10.0000 mg | ORAL_TABLET | Freq: Once | ORAL | Status: AC
  Administered 2018-11-06: 10 mg via ORAL
  Filled 2018-11-06: qty 2

## 2018-11-06 MED ORDER — LORAZEPAM 2 MG/ML IJ SOLN
2.0000 mg | Freq: Once | INTRAMUSCULAR | Status: AC
  Administered 2018-11-06: 2 mg via INTRAMUSCULAR
  Filled 2018-11-06: qty 1

## 2018-11-06 MED ORDER — POTASSIUM CHLORIDE CRYS ER 20 MEQ PO TBCR
40.0000 meq | EXTENDED_RELEASE_TABLET | Freq: Once | ORAL | Status: AC
  Administered 2018-11-07: 40 meq via ORAL
  Filled 2018-11-06: qty 2

## 2018-11-06 MED ORDER — DIPHENHYDRAMINE HCL 50 MG/ML IJ SOLN
50.0000 mg | Freq: Once | INTRAMUSCULAR | Status: AC
  Administered 2018-11-06: 50 mg via INTRAMUSCULAR
  Filled 2018-11-06: qty 1

## 2018-11-06 NOTE — BH Assessment (Signed)
[facility] Assessment Progress Note    Per Nanine MeansJamison Lord, DNP, patient will need to be monitored for safety overnight to determine if current behavior/altered mental status is due to drug induced psychosis or not in order to determine appropriate disposition.

## 2018-11-06 NOTE — ED Notes (Signed)
Specimen cup provided

## 2018-11-06 NOTE — ED Notes (Signed)
Pt behavior bizarre, manic like. not redirectable, Pt delusional., speech pressured, tangential, rapid. Pacing hallway, This nurse notified Jorene MinorsJameson NP of pt behaviors. Awaiting orders.

## 2018-11-06 NOTE — ED Notes (Signed)
Pt refusing to allow this nurse to obtain his labs at this time.

## 2018-11-06 NOTE — ED Notes (Signed)
Received order from [employer]: Order to disclose medical Information.  [place] [employer] Commander to be notified upon and prior to the expected release of pt. [phone]) Order placed in pt shadow chart.
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
## 2018-11-06 NOTE — Progress Notes (Addendum)
Received Jeremiah Christian in his room asleep in bed, he continued to sleep without distress throughout the hourly round checks. He did not arouse after several attempts to administer his night time medications. He took his potassium this morning, but the haldol was held due to sedation.

## 2018-11-06 NOTE — BH Assessment (Addendum)
[facility] Assessment Progress Note   TTS to assess patient once drug screen has been completed and result are in system. TTS looked up patient's current charges: DWI/Probation Violation 11/22/17, Felony Obtaining Property by false pretense, resisting and officer, malicious conduct by a prisoner and destruction of property to be heard in court on 12/14/2018.

## 2018-11-06 NOTE — ED Notes (Addendum)
Pt brought in by [employer], IVC in process. Pt disorganized on approach, speech pressured, tangential, bizarre. Pt comes dressed in layers of clothes, bra on outside of shirt. Pt informs this nurse he was released from jail yesterday. Currently denies SI.  Pt dressed out in wine color scrubs.

## 2018-11-07 NOTE — BH Assessment (Signed)
[facility]  Per Juanetta BeetsJacqueline Norman, DO, this pt does not require psychiatric hospitalization at this time.  Pt presents under IVC initiated by law enforcement, and upheld by EDP Lorre NickAnthony Allen, MD, which Dr Sharma CovertNorman has rescinded.  [facility]  This has been included in pt's discharge instructions.  Pt's nurse, Aram BeechamCynthia, has been notified.  Doylene Canninghomas Ilani Otterson, MA Triage Specialist [phone]
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
## 2018-11-07 NOTE — Discharge Instructions (Signed)
To help you maintain a sober lifestyle, a substance abuse treatment program may be beneficial to you.  Contact [facility] at your earliest opportunity to ask about enrolling in their program: ° °     [facility] °     [address] °     [place], [place] [address] °     [phone] °     New patients are seen at the walk-in clinic every Tuesday from 9:00 am - 12:00 pm °

## 2019-09-06 ENCOUNTER — Other Ambulatory Visit: Payer: Self-pay

## 2019-09-06 ENCOUNTER — Encounter (HOSPITAL_COMMUNITY): Payer: Self-pay | Admitting: Emergency Medicine

## 2019-09-06 ENCOUNTER — Emergency Department (HOSPITAL_COMMUNITY)
Admission: EM | Admit: 2019-09-06 | Discharge: 2019-09-06 | Disposition: A | Discharge status: Home / Self Care | Payer: Self-pay | Attending: Emergency Medicine | Admitting: Emergency Medicine

## 2019-09-06 ENCOUNTER — Emergency Department (HOSPITAL_COMMUNITY): Payer: Self-pay

## 2019-09-06 DIAGNOSIS — Y990 Civilian activity done for income or pay: Secondary | ICD-10-CM | POA: Insufficient documentation

## 2019-09-06 DIAGNOSIS — S92351A Displaced fracture of fifth metatarsal bone, right foot, initial encounter for closed fracture: Secondary | ICD-10-CM | POA: Insufficient documentation

## 2019-09-06 DIAGNOSIS — Y9302 Activity, running: Secondary | ICD-10-CM | POA: Insufficient documentation

## 2019-09-06 DIAGNOSIS — Y999 Unspecified external cause status: Secondary | ICD-10-CM | POA: Insufficient documentation

## 2019-09-06 DIAGNOSIS — Y9289 Other specified places as the place of occurrence of the external cause: Secondary | ICD-10-CM | POA: Insufficient documentation

## 2019-09-06 DIAGNOSIS — X509XXA Other and unspecified overexertion or strenuous movements or postures, initial encounter: Secondary | ICD-10-CM | POA: Insufficient documentation

## 2019-09-06 DIAGNOSIS — F17210 Nicotine dependence, cigarettes, uncomplicated: Secondary | ICD-10-CM | POA: Insufficient documentation

## 2019-09-06 MED ORDER — HYDROCODONE-ACETAMINOPHEN 5-325 MG PO TABS: 1.0000 | ORAL_TABLET | Freq: Four times a day (QID) | ORAL | 0 refills | Status: DC | PRN
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
## 2019-09-06 NOTE — ED Triage Notes (Signed)
Pt reports twisted foot last night at work.

## 2020-08-19 ENCOUNTER — Emergency Department (HOSPITAL_COMMUNITY)
Admission: EM | Admit: 2020-08-19 | Discharge: 2020-08-19 | Disposition: A | Discharge status: Other Acute Inpatient Hospital | Payer: Medicaid Other | Attending: Emergency Medicine | Admitting: Emergency Medicine

## 2020-08-19 ENCOUNTER — Encounter (HOSPITAL_COMMUNITY): Payer: Self-pay

## 2020-08-19 ENCOUNTER — Other Ambulatory Visit: Payer: Self-pay

## 2020-08-19 ENCOUNTER — Ambulatory Visit (HOSPITAL_COMMUNITY)
Admission: EM | Admit: 2020-08-19 | Discharge: 2020-08-20 | Disposition: A | Discharge status: Home / Self Care | Payer: No Payment, Other | Attending: Nurse Practitioner | Admitting: Nurse Practitioner

## 2020-08-19 DIAGNOSIS — F1290 Cannabis use, unspecified, uncomplicated: Secondary | ICD-10-CM | POA: Insufficient documentation

## 2020-08-19 DIAGNOSIS — Z20822 Contact with and (suspected) exposure to covid-19: Secondary | ICD-10-CM | POA: Insufficient documentation

## 2020-08-19 DIAGNOSIS — F17210 Nicotine dependence, cigarettes, uncomplicated: Secondary | ICD-10-CM | POA: Insufficient documentation

## 2020-08-19 DIAGNOSIS — R0981 Nasal congestion: Secondary | ICD-10-CM | POA: Insufficient documentation

## 2020-08-19 DIAGNOSIS — F23 Brief psychotic disorder: Secondary | ICD-10-CM | POA: Insufficient documentation

## 2020-08-19 DIAGNOSIS — F19959 Other psychoactive substance use, unspecified with psychoactive substance-induced psychotic disorder, unspecified: Secondary | ICD-10-CM | POA: Insufficient documentation

## 2020-08-19 DIAGNOSIS — R456 Violent behavior: Secondary | ICD-10-CM | POA: Insufficient documentation

## 2020-08-19 DIAGNOSIS — R4689 Other symptoms and signs involving appearance and behavior: Secondary | ICD-10-CM

## 2020-08-19 DIAGNOSIS — Z5900 Homelessness unspecified: Secondary | ICD-10-CM | POA: Insufficient documentation

## 2020-08-19 DIAGNOSIS — R410 Disorientation, unspecified: Secondary | ICD-10-CM | POA: Insufficient documentation

## 2020-08-19 DIAGNOSIS — Y909 Presence of alcohol in blood, level not specified: Secondary | ICD-10-CM | POA: Insufficient documentation

## 2020-08-19 LAB — RAPID URINE DRUG SCREEN, HOSP PERFORMED
Amphetamines: POSITIVE — AB
Barbiturates: NOT DETECTED
Benzodiazepines: POSITIVE — AB
Cocaine: NOT DETECTED
Opiates: NOT DETECTED
Tetrahydrocannabinol: POSITIVE — AB

## 2020-08-19 LAB — BASIC METABOLIC PANEL
Anion gap: 15 (ref 5–15)
BUN: 14 mg/dL (ref 6–20)
CO2: 21 mmol/L — ABNORMAL LOW (ref 22–32)
Calcium: 8.8 mg/dL — ABNORMAL LOW (ref 8.9–10.3)
Chloride: 102 mmol/L (ref 98–111)
Creatinine, Ser: 0.94 mg/dL (ref 0.61–1.24)
GFR calc non Af Amer: >60 mL/min (ref >60)
Glucose, Bld: 80 mg/dL (ref 70–99)
Potassium: 3.1 mmol/L — ABNORMAL LOW (ref 3.5–5.1)
Sodium: 138 mmol/L (ref 135–145)

## 2020-08-19 LAB — ETHANOL: Alcohol, Ethyl (B): <10 mg/dL (ref <10)

## 2020-08-19 LAB — RESPIRATORY PANEL BY RT PCR (FLU A&B, COVID)
Influenza A by PCR: NEGATIVE
Influenza B by PCR: NEGATIVE
SARS Coronavirus 2 by RT PCR: NEGATIVE

## 2020-08-19 LAB — SALICYLATE LEVEL: Salicylate Lvl: <7 mg/dL — ABNORMAL LOW (ref 7.0–30.0)

## 2020-08-19 LAB — ACETAMINOPHEN LEVEL: Acetaminophen (Tylenol), Serum: <10 ug/mL — ABNORMAL LOW (ref 10–30)

## 2020-08-19 MED ORDER — POTASSIUM CHLORIDE CRYS ER 20 MEQ PO TBCR
40.0000 meq | EXTENDED_RELEASE_TABLET | Freq: Once | ORAL | Status: AC
  Administered 2020-08-20: 40 meq via ORAL
  Filled 2020-08-19: qty 2

## 2020-08-19 MED ORDER — ERYTHROMYCIN 5 MG/GM OP OINT
TOPICAL_OINTMENT | Freq: Four times a day (QID) | OPHTHALMIC | Status: DC
  Filled 2020-08-19: qty 3.5

## 2020-08-19 MED ORDER — ERYTHROMYCIN 5 MG/GM OP OINT
TOPICAL_OINTMENT | Freq: Four times a day (QID) | OPHTHALMIC | Status: DC
  Filled 2020-08-19 (×2): qty 3.5

## 2020-08-19 MED ORDER — IBUPROFEN 200 MG PO TABS: 600.0000 mg | ORAL_TABLET | Freq: Once | ORAL | Status: DC

## 2020-08-19 MED ORDER — MAGNESIUM HYDROXIDE 400 MG/5ML PO SUSP: 30.0000 mL | Freq: Every day | ORAL | Status: DC | PRN

## 2020-08-19 MED ORDER — ALUM & MAG HYDROXIDE-SIMETH 200-200-20 MG/5ML PO SUSP: 30.0000 mL | ORAL | Status: DC | PRN

## 2020-08-19 MED ORDER — ACETAMINOPHEN 325 MG PO TABS: 650.0000 mg | ORAL_TABLET | Freq: Four times a day (QID) | ORAL | Status: DC | PRN

## 2020-08-19 MED ORDER — OLANZAPINE 5 MG PO TBDP
5.0000 mg | ORAL_TABLET | Freq: Once | ORAL | Status: AC
  Administered 2020-08-19: 5 mg via ORAL
  Filled 2020-08-19: qty 1

## 2020-08-19 MED ORDER — TRAZODONE HCL 50 MG PO TABS: 50.0000 mg | ORAL_TABLET | Freq: Every evening | ORAL | Status: DC | PRN

## 2020-08-19 MED ORDER — HYDROXYZINE HCL 25 MG PO TABS: 25.0000 mg | ORAL_TABLET | Freq: Three times a day (TID) | ORAL | Status: DC | PRN

## 2020-08-19 NOTE — ED Notes (Signed)
Pt sleeping in stretcher, breathing even and unlabored. NAD

## 2020-08-19 NOTE — ED Provider Notes (Signed)
Behavioral Health Admission H&P [facility] & OBS)  Date: 08/20/20 Patient Name: Jeremiah Christian MRN: [id number] Chief Complaint:  Chief Complaint  Patient presents with  . Altered Mental Status      Diagnoses:  Final diagnoses:  Substance-induced psychotic disorder (HCC)    HPI: Jeremiah Christian is a 24 y.o. male who presented to [facility] after he was found wandering in out of traffic. Patient became combative when EMS arrived and he was given versed. He received zyprexa 5 mg in the ED.  On evaluation, patient is drowsy and difficult to arouse. His speech is mumbled. Unable to complete a full assessment.   TTS assessment note: Patient is a 24 year old male presenting voluntarily to [facility] ED via [employer] after being found walking around the road, partially dressed, and mumbling nonsensically. Patient received IM Versed en route to ED and slept for hours after arrival. Upon this counselor's exam patient is alert but not oriented. Patient states he is not currently in the hospital and does not know how he got where he is. Patient's speech is disorganized and garbled. His affect is labile. Patient states "my friends told me they think I have schizophrenia. I feel like I'm crazy as hell." He denies SI/HI. He endorses AH stating "yesterday it told me to take the fall of my death." Patient denies any substance use, however, his UDS is positive for amphetamines, THC, and benzodiazepines. 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 Patient's grandmother's phone number is listed in chart. When asked he gives permission for TTS to contact.  Per grandmother, [name] [phone]: Collateral states she has not seen her grandson "in awhile." She states his mother passed away when he was young and "I'm all he's got." She states she does not believe he has been diagnosed with a mental illness but does know he uses substances.   PHQ 2-9:     ED to Hosp-Admission (Discharged) from 04/15/2018 in [facility]TH  SURGICAL  C-SSRS RISK CATEGORY No Risk       Total Time spent with patient: 15 minutes  Musculoskeletal  Strength & Muscle Tone: within normal limits Gait & Station: normal Patient leans: N/A  Psychiatric Specialty Exam  Presentation General Appearance: Disheveled  Eye Contact:Other (comment) (UTA, patient is drowsy and minimally responds)  Speech:Garbled;Other (comment) (UTA, patient is drowsy and minimally responds)  Speech Volume:No data recorded Handedness:No data recorded  Mood and Affect  Mood:No data recorded Affect:Other (comment) (UTA, patient is drowsy and minimally responds)   Thought Process  Thought Processes:Other (comment) (UTA, patient is drowsy and minimally responds)  Descriptions of Associations:No data recorded Orientation:Partial  Thought Content:No data recorded Hallucinations:No data recorded Ideas of Reference:No data recorded Suicidal Thoughts:No data recorded Homicidal Thoughts:No data recorded  Sensorium  Memory:No data recorded Judgment:No data recorded Insight:No data recorded  Executive Functions  Concentration:No data recorded Attention Span:No data recorded Recall:No data recorded Fund of Knowledge:No data recorded Language:No data recorded  Psychomotor Activity  Psychomotor Activity:No data recorded  Assets  Assets:No data recorded  Sleep  Sleep:No data recorded  Physical Exam Constitutional:      General: He is not in acute distress.    Appearance: He is not ill-appearing, toxic-appearing or diaphoretic.  HENT:     Head: Normocephalic.     Right Ear: External ear normal.     Left Ear: External ear normal.  Eyes:     General:  Right eye: Discharge present.        Left eye: Discharge present.    Pupils: Pupils are equal, round, and reactive to light.  Cardiovascular:     Rate and Rhythm: Normal rate.  Pulmonary:     Effort: Pulmonary effort is normal. No respiratory  distress.  Skin:    General: Skin is warm and dry.  Neurological:     Mental Status: He is disoriented.    Review of Systems  Unable to perform ROS: Psychiatric disorder    Blood pressure 106/71, pulse (!) 58, temperature 98.1 F (36.7 C), resp. rate 18, SpO2 98 %. There is no height or weight on file to calculate BMI.  Past Psychiatric History: None Known  Is the patient at risk to self? No  Has the patient been a risk to self in the past 6 months? No .    Has the patient been a risk to self within the distant past? No   Is the patient a risk to others? No   Has the patient been a risk to others in the past 6 months? No   Has the patient been a risk to others within the distant past? No   Past Medical History:  Past Medical History:  Diagnosis Date  . Gonorrhea   . Preterm infant    born 2 months early    Past Surgical History:  Procedure Laterality Date  . CHOLECYSTECTOMY N/A 04/16/2018   Procedure: LAPAROSCOPIC CHOLECYSTECTOMY WITH INTRAOPERATIVE CHOLANGIOGRAM;  Surgeon: Violeta Gelinashompson, Burke, MD;  [facility];  Service: General;  Laterality: N/A;  . NECK SURGERY      Family History:  Family History  Problem Relation Age of Onset  . Kidney failure Mother        ?genetic    Social History:  Social History   Socioeconomic History  . Marital status: Single    Spouse name: Not on file  . Number of children: Not on file  . Years of education: Not on file  . Highest education level: Not on file  Occupational History  . Not on file  Tobacco Use  . Smoking status: Current Every Day Smoker    Packs/day: 0.20    Types: Cigarettes  . Smokeless tobacco: Never Used  Substance and Sexual Activity  . Alcohol use: Yes  . Drug use: Yes    Frequency: 7.0 times per week    Types: Marijuana    Comment: smoked 1/2 blunt today  . Sexual activity: Yes    Birth control/protection: None  Other Topics Concern  . Not on file  Social History Narrative    Pt states that he  has been using marijuana since around age 24. Pt also noted to having mother recently pass away within the last 1-2 years. Currently living with grandmother.    Social Determinants of Health   Financial Resource Strain:   . Difficulty of Paying Living Expenses: Not on file  Food Insecurity:   . Worried About Programme researcher, broadcasting/film/videounning Out of Food in the Last Year: Not on file  . Ran Out of Food in the Last Year: Not on file  Transportation Needs:   . Lack of Transportation (Medical): Not on file  . Lack of Transportation (Non-Medical): Not on file  Physical Activity:   . Days of Exercise per Week: Not on file  . Minutes of Exercise per Session: Not on file  Stress:   . Feeling of Stress : Not on file  Social Connections:   .  Frequency of Communication with Friends and Family: Not on file  . Frequency of Social Gatherings with Friends and Family: Not on file  . Attends Religious Services: Not on file  . Active Member of Clubs or Organizations: Not on file  . Attends Banker Meetings: Not on file  . Marital Status: Not on file  Intimate Partner Violence:   . Fear of Current or Ex-Partner: Not on file  . Emotionally Abused: Not on file  . Physically Abused: Not on file  . Sexually Abused: Not on file    SDOH:  SDOH Screenings   Alcohol Screen:   . Last Alcohol Screening Score (AUDIT): Not on file  Depression (PHQ2-9):   . PHQ-2 Score: Not on file  Financial Resource Strain:   . Difficulty of Paying Living Expenses: Not on file  Food Insecurity:   . Worried About Programme researcher, broadcasting/film/video in the Last Year: Not on file  . Ran Out of Food in the Last Year: Not on file  Housing:   . Last Housing Risk Score: Not on file  Physical Activity:   . Days of Exercise per Week: Not on file  . Minutes of Exercise per Session: Not on file  Social Connections:   . Frequency of Communication with Friends and Family: Not on file  . Frequency of Social Gatherings with Friends and Family: Not on file   . Attends Religious Services: Not on file  . Active Member of Clubs or Organizations: Not on file  . Attends Banker Meetings: Not on file  . Marital Status: Not on file  Stress:   . Feeling of Stress : Not on file  Tobacco Use: High Risk  . Smoking Tobacco Use: Current Every Day Smoker  . Smokeless Tobacco Use: Never Used  Transportation Needs:   . Freight forwarder (Medical): Not on file  . Lack of Transportation (Non-Medical): Not on file    Last Labs:  Admission on 08/19/2020, Discharged on 08/19/2020  Component Date Value Ref Range Status  . Opiates 08/19/2020 NONE DETECTED  NONE DETECTED Final  . Cocaine 08/19/2020 NONE DETECTED  NONE DETECTED Final  . Benzodiazepines 08/19/2020 POSITIVE* NONE DETECTED Final  . Amphetamines 08/19/2020 POSITIVE* NONE DETECTED Final  . Tetrahydrocannabinol 08/19/2020 POSITIVE* NONE DETECTED Final  . Barbiturates 08/19/2020 NONE DETECTED  NONE DETECTED Final   Comment: (NOTE) DRUG SCREEN FOR MEDICAL PURPOSES ONLY.  IF CONFIRMATION IS NEEDED FOR ANY PURPOSE, NOTIFY LAB WITHIN 5 DAYS.  LOWEST DETECTABLE LIMITS FOR URINE DRUG SCREEN Drug Class                     Cutoff (ng/mL) Amphetamine and metabolites    1000 Barbiturate and metabolites    200 Benzodiazepine                 200 Tricyclics and metabolites     300 Opiates and metabolites        300 Cocaine and metabolites        300 THC                            50 Performed at [facility], [address], [place], [place] [address]   . Alcohol, Ethyl (B) 08/19/2020 <10  <10 mg/dL Final   Comment: (NOTE) Lowest detectable limit for serum alcohol is 10 mg/dL.  For medical purposes only. Performed at [facility],  [address] [place], [place] [address]   . Acetaminophen (Tylenol), Serum 08/19/2020 <10* 10 - 30 ug/mL Final   Comment: (NOTE) Therapeutic concentrations vary significantly. A range of 10-30 ug/mL  may be  an effective concentration for many patients. However, some  are best treated at concentrations outside of this range. Acetaminophen concentrations >150 ug/mL at 4 hours after ingestion  and >50 ug/mL at 12 hours after ingestion are often associated with  toxic reactions.  Performed at [facility], [address] [place], [place] [address]   . Salicylate Lvl 08/19/2020 <7.0* 7.0 - 30.0 mg/dL Final   Performed at [facility], [address], [place], [place] [address]  . Sodium 08/19/2020 138  135 - 145 mmol/L Final  . Potassium 08/19/2020 3.1* 3.5 - 5.1 mmol/L Final  . Chloride 08/19/2020 102  98 - 111 mmol/L Final  . CO2 08/19/2020 21* 22 - 32 mmol/L Final  . Glucose, Bld 08/19/2020 80  70 - 99 mg/dL Final   Glucose reference range applies only to samples taken after fasting for at least 8 hours.  . BUN 08/19/2020 14  6 - 20 mg/dL Final  . Creatinine, Ser 08/19/2020 0.94  0.61 - 1.24 mg/dL Final  . Calcium 25/85/2778 8.8* 8.9 - 10.3 mg/dL Final  . GFR calc non Af Amer 08/19/2020 >60  >60 mL/min Final  . Anion gap 08/19/2020 15  5 - 15 Final   Performed at [facility], [address], [place], [place] [address]  . SARS Coronavirus 2 by RT PCR 08/19/2020 NEGATIVE  NEGATIVE Final   Comment: (NOTE) SARS-CoV-2 target nucleic acids are NOT DETECTED.  The SARS-CoV-2 RNA is generally detectable in upper respiratoy specimens during the acute phase of infection. The lowest concentration of SARS-CoV-2 viral copies this assay can detect is 131 copies/mL. A negative result does not preclude SARS-Cov-2 infection and should not be used as the sole basis for treatment or other patient management decisions. A negative result may occur with  improper specimen collection/handling, submission of specimen other than nasopharyngeal swab, presence of viral mutation(s) within the areas targeted by this assay, and inadequate number of viral  copies (<131 copies/mL). A negative result must be combined with clinical observations, patient history, and epidemiological information. The expected result is Negative.  Fact Sheet for Patients:  https://www.moore.com/  Fact Sheet for Healthcare Providers:  https://www.young.biz/  This test is no                          t yet approved or cleared by the [place] FDA and  has been authorized for detection and/or diagnosis of SARS-CoV-2 by FDA under an Emergency Use Authorization (EUA). This EUA will remain  in effect (meaning this test can be used) for the duration of the COVID-19 declaration under Section 564(b)(1) of the Act, 21 U.S.C. section 360bbb-3(b)(1), unless the authorization is terminated or revoked sooner.    . Influenza A by PCR 08/19/2020 NEGATIVE  NEGATIVE Final  . Influenza B by PCR 08/19/2020 NEGATIVE  NEGATIVE Final   Comment: (NOTE) The Xpert Xpress SARS-CoV-2/FLU/RSV assay is intended as an aid in  the diagnosis of influenza from Nasopharyngeal swab specimens and  should not be used as a sole basis for treatment. Nasal washings and  aspirates are unacceptable for Xpert Xpress SARS-CoV-2/FLU/RSV  testing.  Fact Sheet for Patients: https://www.moore.com/  Fact Sheet for Healthcare Providers: https://www.young.biz/  This test  is not yet approved or cleared by the [place] and  has been authorized for detection and/or diagnosis of SARS-CoV-2 by  FDA under an Emergency Use Authorization (EUA). This EUA will remain  in effect (meaning this test can be used) for the duration of the  Covid-19 declaration under Section 564(b)(1) of the Act, 21  U.S.C. section 360bbb-3(b)(1), unless the authorization is  terminated or revoked. Performed at [facility], [address] [place], [place] [address]     Allergies: Banana  PTA Medications: (Not in a  hospital admission)   Medical Decision Making  Patient was medically cleared in the ED He was started on erythromycin ophthalmic ointment for bilateral conjunctivitis (thought to be viral) Potassium 3.1-was not replaced in the ED. Will replace with 40 mEQ potassium orally x 1 dose.   Agitation Protocol: Haldol 5 mg IM or PO Cogentin 1 mg IM or PO Ativan 1 mg IM or PO       Recommendations  Based on my evaluation the patient does not appear to have an emergency medical condition.   Patient will be placed in the continuous assessment area at [facility] for treatment and stabilization. He will be reevaluated on 08/20/2020. The treatment team will determine disposition at that time.      Jackelyn Poling, NP 08/20/20  12:26 AM

## 2020-08-19 NOTE — ED Notes (Signed)
Personal items are in a bag in the shelf at nurses station 16-22

## 2020-08-19 NOTE — ED Notes (Signed)
Safe transport called for transport. 

## 2020-08-19 NOTE — ED Notes (Signed)
[address] 
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
## 2020-08-19 NOTE — ED Triage Notes (Signed)
Pt transferred from [facility]. A&Ox2 only at this time, very somnolent, mumbling occasionally to questions. Per ED report pt received Versed in field by EMS and Zyprexa @ ED. Skin assessment completed and pt taken to Obs area to lie down.

## 2020-08-19 NOTE — ED Notes (Signed)
Pt completing TTS consult at this time.  

## 2020-08-19 NOTE — BHH Counselor (Signed)
Patient accepted to [facility] pending medical clearance. Patient may arrive after 2000.  Accepting provider is Assunta Found, NP. Attending provider is Dr. Lucianne Muss. Please call report to [phone].

## 2020-08-19 NOTE — ED Triage Notes (Signed)
PD called to scene, pt mumbling and not making any sense, walking around the road. Pt became combative, restrained en route by EMS, given 5 mg of IM Versed. Per EMS, pt was asleep en route for EMS, but was very combative for police and initially on scene. BP 124/88, HR 88, CBG 122, temp 97.3. Pt was rambling and not making sense per EMS but they were able to find out that pt has a hx of marijuana use.

## 2020-08-19 NOTE — ED Notes (Signed)
Patient noted to be agitated, responding to internal stimuli. Patient requesting crackers. Informed patient if he could sit down, we could provide him with crackers. Firm boundaries placed. Patient paced in hallway speaking to himself and eventually sat down. Crackers provided.

## 2020-08-19 NOTE — ED Notes (Addendum)
Placed condom catheter on patient/ Purple scrubs

## 2020-08-19 NOTE — ED Notes (Signed)
Attempted to call report to [facility]. RN unavailable to take report at this time.

## 2020-08-20 MED ORDER — HALOPERIDOL 5 MG PO TABS: 5.0000 mg | ORAL_TABLET | Freq: Four times a day (QID) | ORAL | Status: DC | PRN

## 2020-08-20 MED ORDER — LORAZEPAM 1 MG PO TABS: 1.0000 mg | ORAL_TABLET | Freq: Four times a day (QID) | ORAL | Status: DC | PRN

## 2020-08-20 MED ORDER — HALOPERIDOL LACTATE 5 MG/ML IJ SOLN: 5.0000 mg | Freq: Four times a day (QID) | INTRAMUSCULAR | Status: DC | PRN

## 2020-08-20 MED ORDER — BENZTROPINE MESYLATE 1 MG/ML IJ SOLN: 0.5000 mg | Freq: Four times a day (QID) | INTRAMUSCULAR | Status: DC | PRN

## 2020-08-20 MED ORDER — LORAZEPAM 2 MG/ML IJ SOLN: 1.0000 mg | Freq: Four times a day (QID) | INTRAMUSCULAR | Status: DC | PRN

## 2020-08-20 MED ORDER — BENZTROPINE MESYLATE 0.5 MG PO TABS: 0.5000 mg | ORAL_TABLET | Freq: Four times a day (QID) | ORAL | Status: DC | PRN

## 2020-08-20 MED ORDER — BACITRACIN-POLYMYXIN B 500-10000 UNIT/GM OP OINT
TOPICAL_OINTMENT | Freq: Four times a day (QID) | OPHTHALMIC | Status: DC
  Filled 2020-08-20: qty 3.5

## 2020-08-20 NOTE — ED Provider Notes (Signed)
FBC/OBS ASAP Discharge Summary  Date and Time: 08/20/2020 10:17 AM  Name: Jeremiah Christian  MRN:  [id number]   Discharge Diagnoses:  Final diagnoses:  Substance-induced psychotic disorder [facility])    Subjective: Patient reports this morning he is doing okay.  He states he does not remember a lot about when he came in yesterday but does admit to using methamphetamines recently.  Patient states that he is used drugs for quite some time now.  Patient is asked if he is interested in substance abuse treatment patient denied.  He did state that he was homeless and he did not really want to have to go back to the streets.  Patient was offered multiple housing options and he stated that he wanted to stay in the [place] area but he would check withthis himself.  Patient has continually denied any suicidal or homicidal ideations and denied any hallucinations today.  Patient stated that he felt that he was ready to discharge.  Stay Summary: Patient is a 24 year old male presented [facility] after he was found wandering out in traffic.  Patient was combative and received Versed from EMS.  Patient was transported to the ED where he received Zyprexa 5 mg.  Patient was able to calm down but presented partially dressed, walking into traffic, and mumbling nonsensically.  [facility] was [facility].  He denied any suicidal homicidal ideations and denied any hallucinations.  Patient reported he has schizophrenia and denied any substance abuse.  Patient's UDS is positive for amphetamines, THC, and benzodiazepines.  Patient remained overnight and upon waking patient is stated that he was feeling better today.  He denied any suicidal homicidal ideations and denied any hallucinations.  Patient was interested in housing but stated he wanted stay in the [facility] area.  Patient was provided with housing resource list and he stated he was ready to go.  Patient was informed and educated on the open Access at  the [facility] for outpatient resources as well as the SA IOP that is offered at the [facility].  Patient stated that he was not interested in any substance abuse treatment at this time and wanted to be discharged.  Total Time spent with patient: 30 minutes  Past Psychiatric History: Polysubstance abuse, aggressive behavior Past Medical History:  Past Medical History:  Diagnosis Date  . Gonorrhea   . Preterm infant    born 2 months early    Past Surgical History:  Procedure Laterality Date  . CHOLECYSTECTOMY N/A 04/16/2018   Procedure: LAPAROSCOPIC CHOLECYSTECTOMY WITH INTRAOPERATIVE CHOLANGIOGRAM;  Surgeon: Violeta Gelinas, MD;  Location: [facility] OR;  Service: General;  Laterality: N/A;  . NECK SURGERY     Family History:  Family History  Problem Relation Age of Onset  . Kidney failure Mother        ?genetic   Family Psychiatric History: None reported Social History:  Social History   Substance and Sexual Activity  Alcohol Use Yes     Social History   Substance and Sexual Activity  Drug Use Yes  . Frequency: 7.0 times per week  . Types: Marijuana   Comment: smoked 1/2 blunt today    Social History   Socioeconomic History  . Marital status: Single    Spouse name: Not on file  . Number of children: Not on file  . Years of education: Not on file  . Highest education level: Not on file  Occupational History  . Not on file  Tobacco Use  . Smoking status: Current Every Day Smoker    Packs/day: 0.20    Types: Cigarettes  . Smokeless tobacco: Never Used  Substance and Sexual Activity  . Alcohol use: Yes  . Drug use: Yes    Frequency: 7.0 times per week    Types: Marijuana    Comment: smoked 1/2 blunt today  . Sexual activity: Yes    Birth control/protection: None  Other Topics Concern  . Not on file  Social History Narrative    Pt states that he has been using marijuana since around age 50. Pt also noted to having mother recently pass away within the last 1-2 years. Currently  living with grandmother.    Social Determinants of Health   Financial Resource Strain:   . Difficulty of Paying Living Expenses: Not on file  Food Insecurity:   . Worried About Programme researcher, broadcasting/film/video in the Last Year: Not on file  . Ran Out of Food in the Last Year: Not on file  Transportation Needs:   . Lack of Transportation (Medical): Not on file  . Lack of Transportation (Non-Medical): Not on file  Physical Activity:   . Days of Exercise per Week: Not on file  . Minutes of Exercise per Session: Not on file  Stress:   . Feeling of Stress : Not on file  Social Connections:   . Frequency of Communication with Friends and Family: Not on file  . Frequency of Social Gatherings with Friends and Family: Not on file  . Attends Religious Services: Not on file  . Active Member of Clubs or Organizations: Not on file  . Attends Banker Meetings: Not on file  . Marital Status: Not on file   SDOH:  SDOH Screenings   Alcohol Screen:   . Last Alcohol Screening Score (AUDIT): Not on file  Depression (PHQ2-9):   . PHQ-2 Score: Not on file  Financial Resource Strain:   . Difficulty of Paying Living Expenses: Not on file  Food Insecurity:   . Worried About Programme researcher, broadcasting/film/video in the Last Year: Not on file  . Ran Out of Food in the Last Year: Not on file  Housing:   . Last Housing Risk Score: Not on file  Physical Activity:   . Days of Exercise per Week: Not on file  . Minutes of Exercise per Session: Not on file  Social Connections:   . Frequency of Communication with Friends and Family: Not on file  . Frequency of Social Gatherings with Friends and Family: Not on file  . Attends Religious Services: Not on file  . Active Member of Clubs or Organizations: Not on file  . Attends Banker Meetings: Not on file  . Marital Status: Not on file  Stress:   . Feeling of Stress : Not on file  Tobacco Use: High Risk  . Smoking Tobacco Use: Current Every Day Smoker  .  Smokeless Tobacco Use: Never Used  Transportation Needs:   . Freight forwarder (Medical): Not on file  . Lack of Transportation (Non-Medical): Not on file    Has this patient used any form of tobacco in the last 30 days? (Cigarettes, Smokeless Tobacco, Cigars, and/or Pipes) A prescription for an FDA-approved tobacco cessation medication was offered at discharge and the patient refused  Current Medications:  Current Facility-Administered Medications  Medication Dose Route Frequency Provider Last Rate Last Admin  . acetaminophen (TYLENOL) tablet 650 mg  650 mg Oral  Q6H PRN Jackelyn PolingBerry, Jason A, NP      . alum & mag hydroxide-simeth (MAALOX/MYLANTA) 200-200-20 MG/5ML suspension 30 mL  30 mL Oral Q4H PRN Jackelyn PolingBerry, Jason A, NP      . bacitracin-polymyxin b (POLYSPORIN) ophthalmic ointment   Both Eyes Q6H Jerson Furukawa, Gerlene Burdockravis B, FNP   Given at 08/20/20 0947  . haloperidol (HALDOL) tablet 5 mg  5 mg Oral Q6H PRN Nira ConnBerry, Jason A, NP       And  . benztropine (COGENTIN) tablet 0.5 mg  0.5 mg Oral Q6H PRN Jackelyn PolingBerry, Jason A, NP       And  . LORazepam (ATIVAN) tablet 1 mg  1 mg Oral Q6H PRN Nira ConnBerry, Jason A, NP      . haloperidol lactate (HALDOL) injection 5 mg  5 mg Intramuscular Q6H PRN Jackelyn PolingBerry, Jason A, NP       And  . LORazepam (ATIVAN) injection 1 mg  1 mg Intramuscular Q6H PRN Nira ConnBerry, Jason A, NP       And  . benztropine mesylate (COGENTIN) injection 0.5 mg  0.5 mg Intramuscular Q6H PRN Nira ConnBerry, Jason A, NP      . hydrOXYzine (ATARAX/VISTARIL) tablet 25 mg  25 mg Oral TID PRN Nira ConnBerry, Jason A, NP      . magnesium hydroxide (MILK OF MAGNESIA) suspension 30 mL  30 mL Oral Daily PRN Nira ConnBerry, Jason A, NP      . traZODone (DESYREL) tablet 50 mg  50 mg Oral QHS PRN Jackelyn PolingBerry, Jason A, NP       Current Outpatient Medications  Medication Sig Dispense Refill  . acetaminophen (TYLENOL) 325 MG tablet Take 2 tablets (650 mg total) by mouth every 4 (four) hours as needed for mild pain (or temp > 100). (Patient not taking: Reported on  08/20/2020)    . famotidine (PEPCID) 20 MG tablet Take 1 tablet (20 mg total) by mouth 2 (two) times daily. (Patient not taking: Reported on 11/07/2018) 10 tablet 0  . HYDROcodone-acetaminophen (NORCO/VICODIN) 5-325 MG tablet Take 1 tablet by mouth every 6 (six) hours as needed for severe pain. (Patient not taking: Reported on 08/20/2020) 10 tablet 0  . ondansetron (ZOFRAN ODT) 4 MG disintegrating tablet Take 1 tablet (4 mg total) by mouth every 8 (eight) hours as needed for nausea or vomiting. (Patient not taking: Reported on 11/07/2018) 10 tablet 0  . oxyCODONE (OXY IR/ROXICODONE) 5 MG immediate release tablet Take 1 tablet (5 mg total) by mouth every 4 (four) hours as needed for moderate pain. (Patient not taking: Reported on 11/07/2018) 15 tablet 0    PTA Medications: (Not in a hospital admission)   Musculoskeletal  Strength & Muscle Tone: within normal limits Gait & Station: normal Patient leans: N/A  Psychiatric Specialty Exam  Presentation  General Appearance: Appropriate for Environment;Disheveled  Eye Contact:Good  Speech:Clear and Coherent;Normal Rate  Speech Volume:Normal  Handedness:Right   Mood and Affect  Mood:Euthymic  Affect:Appropriate;Congruent   Thought Process  Thought Processes:Coherent  Descriptions of Associations:Intact  Orientation:Full (Time, Place and Person)  Thought Content:WDL  Hallucinations:Hallucinations: None  Ideas of Reference:None  Suicidal Thoughts:Suicidal Thoughts: No  Homicidal Thoughts:Homicidal Thoughts: No   Sensorium  Memory:Immediate Good;Recent Good;Remote Good  Judgment:Fair  Insight:Fair   Executive Functions  Concentration:Good  Attention Span:Good  Recall:Fair  Fund of Knowledge:Fair  Language:Good   Psychomotor Activity  Psychomotor Activity:Psychomotor Activity: Normal   Assets  Assets:Communication Skills;Desire for Improvement;Physical Health;Social Support   Sleep  Sleep:Sleep:  Good   Physical Exam  Physical Exam Vitals and nursing note reviewed.  Constitutional:      Appearance: He is well-developed.  HENT:     Head: Normocephalic.  Eyes:     Pupils: Pupils are equal, round, and reactive to light.  Cardiovascular:     Rate and Rhythm: Normal rate.  Pulmonary:     Effort: Pulmonary effort is normal.  Musculoskeletal:        General: Normal range of motion.  Neurological:     Mental Status: He is alert and oriented to person, place, and time.    Review of Systems  Constitutional: Negative.   HENT: Negative.   Eyes: Negative.   Respiratory: Negative.   Cardiovascular: Negative.   Gastrointestinal: Negative.   Genitourinary: Negative.   Musculoskeletal: Negative.   Skin: Negative.   Neurological: Negative.   Endo/Heme/Allergies: Negative.   Psychiatric/Behavioral: Positive for substance abuse.   Blood pressure 113/76, pulse 65, temperature (!) 97.5 F (36.4 C), temperature source Tympanic, resp. rate 18, SpO2 99 %. There is no height or weight on file to calculate BMI.  Demographic Factors:  Male and Low socioeconomic status  Loss Factors: NA  Historical Factors: NA  Risk Reduction Factors:   NA  Continued Clinical Symptoms:  Alcohol/Substance Abuse/Dependencies  Cognitive Features That Contribute To Risk:  None    Suicide Risk:  Mild:  Suicidal ideation of limited frequency, intensity, duration, and specificity.  There are no identifiable plans, no associated intent, mild dysphoria and related symptoms, good self-control (both objective and subjective assessment), few other risk factors, and identifiable protective factors, including available and accessible social support.  Plan Of Care/Follow-up recommendations:  Continue activity as tolerated. Continue diet as recommended by your PCP. Ensure to keep all appointments with outpatient providers.  Disposition: Discharge home with resources  Maryfrances Bunnell, FNP 08/20/2020, 10:17  AM

## 2020-08-20 NOTE — ED Notes (Signed)
Escorted pt to front lobby. Pt ambulated per self. Pt c/o about not being able to see out of one eye d/t cojunctivitsis. Advised that's why we provided medicated ointment that he verbalized understanding to earlier. Became beligerant, advised that pt was d/c and that if wanted further tx for eye issue that he would need to be seen at ED. Offered to call transportation to take to ED. Pt refused. Pt stable at time of d/c

## 2020-08-20 NOTE — ED Notes (Signed)
Optic ointment administered per md orders. Educated pt on med. Verbalized understanding. Left eye red and puffy. Right eye red, puffy, and greenish discharge noted around eye.  Will continue to monitor

## 2020-08-20 NOTE — ED Notes (Signed)
Pt resting with eyes closed. Rise and fall of chest noted. Will continue to monitor for safety 

## 2020-08-20 NOTE — Discharge Instructions (Addendum)
Patient is instructed prior to discharge to: Take all medications as prescribed by his/her mental healthcare provider. Report any adverse effects and or reactions from the medicines to his/her outpatient provider promptly. Patient has been instructed & cautioned: To not engage in alcohol and or illegal drug use while on prescription medicines. In the event of worsening symptoms, patient is instructed to call the crisis hotline, 911 and or go to the nearest ED for appropriate evaluation and treatment of symptoms. To follow-up with his/her primary care provider for your other medical issues, concerns and or health care needs.   Patient is instructed prior to discharge to: Take all medications as prescribed by his/her mental healthcare provider. Report any adverse effects and or reactions from the medicines to his/her outpatient provider promptly. Patient has been instructed & cautioned: To not engage in alcohol and or illegal drug use while on prescription medicines. In the event of worsening symptoms, patient is instructed to call the crisis hotline, 911 and or go to the nearest ED for appropriate evaluation and treatment of symptoms. To follow-up with his/her primary care provider for your other medical issues, concerns and or health care needs.

## 2020-08-20 NOTE — ED Notes (Signed)
Educated pt about avs. Verbalized understanding. Retrieved pt belongings. Gave bus pass. Pt changing clothes

## 2020-09-03 ENCOUNTER — Emergency Department (HOSPITAL_COMMUNITY)
Admission: EM | Admit: 2020-09-03 | Discharge: 2020-09-04 | Disposition: A | Discharge status: Home / Self Care | Payer: Medicaid Other | Attending: Emergency Medicine | Admitting: Emergency Medicine

## 2020-09-03 ENCOUNTER — Encounter (HOSPITAL_COMMUNITY): Payer: Self-pay

## 2020-09-03 ENCOUNTER — Other Ambulatory Visit: Payer: Self-pay

## 2020-09-03 DIAGNOSIS — F17210 Nicotine dependence, cigarettes, uncomplicated: Secondary | ICD-10-CM | POA: Insufficient documentation

## 2020-09-03 DIAGNOSIS — H1031 Unspecified acute conjunctivitis, right eye: Secondary | ICD-10-CM | POA: Insufficient documentation

## 2020-09-03 DIAGNOSIS — L853 Xerosis cutis: Secondary | ICD-10-CM | POA: Insufficient documentation

## 2020-09-03 NOTE — ED Triage Notes (Signed)
Pt arrives to ED w/ c/o R foot pain secondary to a blister. Pt has psych hx.

## 2020-09-04 MED ORDER — POLYMYXIN B-TRIMETHOPRIM 10000-0.1 UNIT/ML-% OP SOLN
1.0000 [drp] | OPHTHALMIC | Status: DC
  Administered 2020-09-04: 1 [drp] via OPHTHALMIC
  Filled 2020-09-04: qty 10

## 2020-09-04 MED ORDER — HYDROCERIN EX CREA
TOPICAL_CREAM | Freq: Two times a day (BID) | CUTANEOUS | Status: DC
  Filled 2020-09-04: qty 113

## 2020-09-04 NOTE — Discharge Instructions (Addendum)
Instill 1 drop in both eyes every 4 hours that you are awake for 5 days.  Apply the lotion to the foot as needed.

## 2020-09-04 NOTE — ED Notes (Signed)
Discharge instructions reviewed with patient and patient verbalized understanding. Patient wheeled out by tech. Vital signs stable and patient is nad.
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
## 2021-05-20 ENCOUNTER — Emergency Department (HOSPITAL_COMMUNITY)
Admission: EM | Admit: 2021-05-20 | Discharge: 2021-05-21 | Disposition: A | Discharge status: Home / Self Care | Payer: Self-pay | Attending: Emergency Medicine | Admitting: Emergency Medicine

## 2021-05-20 DIAGNOSIS — F17210 Nicotine dependence, cigarettes, uncomplicated: Secondary | ICD-10-CM | POA: Insufficient documentation

## 2021-05-20 DIAGNOSIS — Z711 Person with feared health complaint in whom no diagnosis is made: Secondary | ICD-10-CM

## 2021-05-20 DIAGNOSIS — Z202 Contact with and (suspected) exposure to infections with a predominantly sexual mode of transmission: Secondary | ICD-10-CM | POA: Insufficient documentation

## 2021-05-20 DIAGNOSIS — R21 Rash and other nonspecific skin eruption: Secondary | ICD-10-CM | POA: Insufficient documentation

## 2021-05-20 LAB — URINALYSIS, ROUTINE W REFLEX MICROSCOPIC
Bilirubin Urine: NEGATIVE
Glucose, UA: NEGATIVE mg/dL
Hgb urine dipstick: NEGATIVE
Ketones, ur: NEGATIVE mg/dL
Leukocytes,Ua: NEGATIVE
Nitrite: NEGATIVE
Protein, ur: NEGATIVE mg/dL
Specific Gravity, Urine: 1.016 (ref 1.005–1.030)
pH: 6 (ref 5.0–8.0)

## 2021-05-20 LAB — HIV ANTIBODY (ROUTINE TESTING W REFLEX): HIV Screen 4th Generation wRfx: NONREACTIVE

## 2021-05-20 NOTE — ED Provider Notes (Signed)
Emergency Medicine Provider Triage Evaluation Note  Jeremiah Christian , a 25 y.o. male  was evaluated in triage.  Pt complains of rash and concern for syphilis.  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  Patient reports that he received 1 shot of penicillin and was told to follow-up for another round of treatment.  Patient never received second round of treatment.  Patient states that he has developed a sore to the left side of the face as well as a rash to the right side of his arm.  This rash has been intermittent since he was in jail February 2022.  Patient is also concerned that he may have contracted another sexually transmitted infection.  Patient reports that he is in a mutually monogamous relationship with 1 partner.  Patient denies any dysuria, hematuria, genital sores or lesions, penile discharge, testicular pain, swelling or tenderness to penis, fever, chills.  Review of Systems  Positive: rash Negative: dysuria, hematuria, genital sores or lesions, penile discharge, testicular pain, swelling or tenderness to penis, fever, chills  Physical Exam  BP 127/80 (BP Location: Left Arm)   Pulse 77   Temp 98.5 F (36.9 C) (Oral)   Resp 16   SpO2 98%  Gen:   Awake, no distress   Resp:  Normal effort  MSK:   Moves extremities without difficulty  Other:  Small wound to left cheek, 2 erythematous papule to right forearm.  Medical Decision Making  Medically screening exam initiated at 2:34 PM.  Appropriate orders placed.  DURIEL DEERY was informed that the remainder of the evaluation will be completed by another provider, this initial triage assessment does not replace that evaluation, and the importance of remaining in the ED until their evaluation is complete.  The patient appears stable so that the remainder of the work up may be completed by another provider.      Haskel Schroeder, PA-C 05/20/21 1436    Arby Barrette, MD 05/21/21 [phone]

## 2021-05-20 NOTE — ED Triage Notes (Signed)
Pt here from home wanting his "second " shot for syphilis,

## 2021-05-21 LAB — RPR
RPR Ser Ql: REACTIVE — AB
RPR Titer: 1:1 {titer}

## 2021-05-21 MED ORDER — PENICILLIN G BENZATHINE 1200000 UNIT/2ML IM SUSY
2.4000 10*6.[IU] | PREFILLED_SYRINGE | Freq: Once | INTRAMUSCULAR | Status: AC
  Administered 2021-05-21: 2.4 10*6.[IU] via INTRAMUSCULAR
  Filled 2021-05-21: qty 4
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
## 2021-05-21 NOTE — Discharge Instructions (Addendum)
Thank you for allowing me to care for you today in the Emergency Department.   Your testing for syphilis is pending.  You can follow-up with these results at the infectious disease clinic.  Their office information is listed above.  You can call to make an appointment for follow-up.  Use a condom when you are having sex.  This can prevent syphilis, HIV, gonorrhea, chlamydia, and other sexually transmitted diseases.  Return to the emergency department if you have severe abdominal or pelvic pain, fever, uncontrollable vomiting, confusion, or other new, concerning symptoms.

## 2021-05-22 LAB — URINE CULTURE: Culture: 10000 — AB

## 2021-05-24 LAB — T.PALLIDUM AB, TOTAL: T Pallidum Abs: REACTIVE — AB

## 2021-07-18 ENCOUNTER — Other Ambulatory Visit: Payer: Self-pay

## 2021-07-18 ENCOUNTER — Emergency Department
Admission: EM | Admit: 2021-07-18 | Discharge: 2021-07-18 | Disposition: A | Discharge status: Home / Self Care | Payer: Medicaid Other | Attending: Emergency Medicine | Admitting: Emergency Medicine

## 2021-07-18 DIAGNOSIS — Z711 Person with feared health complaint in whom no diagnosis is made: Secondary | ICD-10-CM

## 2021-07-18 DIAGNOSIS — Z202 Contact with and (suspected) exposure to infections with a predominantly sexual mode of transmission: Secondary | ICD-10-CM | POA: Insufficient documentation

## 2021-07-18 DIAGNOSIS — F17210 Nicotine dependence, cigarettes, uncomplicated: Secondary | ICD-10-CM | POA: Insufficient documentation

## 2021-07-18 LAB — CHLAMYDIA/NGC RT PCR (ARMC ONLY)
Chlamydia Tr: NOT DETECTED
N gonorrhoeae: NOT DETECTED

## 2021-07-18 MED ORDER — PENICILLIN G BENZATHINE 1200000 UNIT/2ML IM SUSY
2.4000 10*6.[IU] | PREFILLED_SYRINGE | Freq: Once | INTRAMUSCULAR | Status: AC
  Administered 2021-07-18: 2.4 10*6.[IU] via INTRAMUSCULAR
  Filled 2021-07-18: qty 4

## 2021-07-18 NOTE — Discharge Instructions (Addendum)
You have been treated for syphilis. Your blood labs, however, are pending.  Follow-up with a local health department for further treatment.

## 2021-07-18 NOTE — ED Triage Notes (Signed)
Pt arrives to ER c/o of needing a shot for STD. States has had it in the back. Denies discharge.
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
## 2021-07-19 LAB — HIV ANTIBODY (ROUTINE TESTING W REFLEX): HIV Screen 4th Generation wRfx: NONREACTIVE

## 2021-07-19 LAB — RPR
RPR Ser Ql: REACTIVE — AB
RPR Titer: 1:1 {titer}

## 2021-07-21 LAB — T.PALLIDUM AB, TOTAL: T Pallidum Abs: REACTIVE — AB

## 2021-10-23 ENCOUNTER — Other Ambulatory Visit: Payer: Self-pay

## 2021-10-23 ENCOUNTER — Encounter (HOSPITAL_COMMUNITY): Payer: Self-pay

## 2021-10-23 ENCOUNTER — Emergency Department (HOSPITAL_COMMUNITY)
Admission: EM | Admit: 2021-10-23 | Discharge: 2021-10-23 | Disposition: A | Discharge status: Home / Self Care | Payer: Medicaid Other | Attending: Emergency Medicine | Admitting: Emergency Medicine

## 2021-10-23 DIAGNOSIS — Z711 Person with feared health complaint in whom no diagnosis is made: Secondary | ICD-10-CM

## 2021-10-23 DIAGNOSIS — Z202 Contact with and (suspected) exposure to infections with a predominantly sexual mode of transmission: Secondary | ICD-10-CM | POA: Insufficient documentation

## 2021-10-23 HISTORY — DX: Syphilis, unspecified: A53.9

## 2021-10-23 LAB — URINALYSIS, ROUTINE W REFLEX MICROSCOPIC
Bilirubin Urine: NEGATIVE
Glucose, UA: NEGATIVE mg/dL
Hgb urine dipstick: NEGATIVE
Ketones, ur: NEGATIVE mg/dL
Leukocytes,Ua: NEGATIVE
Nitrite: NEGATIVE
Protein, ur: NEGATIVE mg/dL
Specific Gravity, Urine: 1.015 (ref 1.005–1.030)
pH: 7 (ref 5.0–8.0)

## 2021-10-23 LAB — HIV ANTIBODY (ROUTINE TESTING W REFLEX): HIV Screen 4th Generation wRfx: NONREACTIVE

## 2021-10-23 MED ORDER — PENICILLIN G BENZATHINE 1200000 UNIT/2ML IM SUSY
2.4000 10*6.[IU] | PREFILLED_SYRINGE | Freq: Once | INTRAMUSCULAR | Status: AC
  Administered 2021-10-23: 2.4 10*6.[IU] via INTRAMUSCULAR
  Filled 2021-10-23: qty 4
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
## 2021-10-23 NOTE — ED Triage Notes (Signed)
Patient reports that he was diagnosed with syphilis and states he knows he is suppose to have 3 shots and only received one.

## 2021-10-23 NOTE — Discharge Instructions (Addendum)
Given you your shot for possible syphilis.  We have also repeated these labs.  Please follow-up with infectious disease, return here for additional shot in 1 week.  Return for new or worsening symptoms.

## 2021-10-24 LAB — RPR
RPR Ser Ql: REACTIVE — AB
RPR Titer: 1:2 {titer}

## 2021-10-26 LAB — GC/CHLAMYDIA PROBE AMP (~~LOC~~) NOT AT ARMC
Chlamydia: NEGATIVE
Comment: NEGATIVE
Comment: NORMAL
Neisseria Gonorrhea: NEGATIVE

## 2021-10-26 LAB — T.PALLIDUM AB, TOTAL: T Pallidum Abs: REACTIVE — AB

## 2021-10-30 ENCOUNTER — Ambulatory Visit: Payer: Medicaid Other | Admitting: Infectious Diseases

## 2021-10-30 ENCOUNTER — Other Ambulatory Visit: Payer: Self-pay

## 2021-10-30 ENCOUNTER — Emergency Department (HOSPITAL_COMMUNITY)
Admission: EM | Admit: 2021-10-30 | Discharge: 2021-10-30 | Disposition: A | Discharge status: Home / Self Care | Payer: Medicaid Other | Attending: Emergency Medicine | Admitting: Emergency Medicine

## 2021-10-30 ENCOUNTER — Encounter (HOSPITAL_COMMUNITY): Payer: Self-pay

## 2021-10-30 DIAGNOSIS — F17210 Nicotine dependence, cigarettes, uncomplicated: Secondary | ICD-10-CM | POA: Insufficient documentation

## 2021-10-30 DIAGNOSIS — Z202 Contact with and (suspected) exposure to infections with a predominantly sexual mode of transmission: Secondary | ICD-10-CM | POA: Insufficient documentation

## 2021-10-30 DIAGNOSIS — Z711 Person with feared health complaint in whom no diagnosis is made: Secondary | ICD-10-CM

## 2021-10-30 MED ORDER — PENICILLIN G BENZATHINE 1200000 UNIT/2ML IM SUSY
2.4000 10*6.[IU] | PREFILLED_SYRINGE | Freq: Once | INTRAMUSCULAR | Status: AC
  Administered 2021-10-30: 2.4 10*6.[IU] via INTRAMUSCULAR
  Filled 2021-10-30: qty 4

## 2021-10-30 NOTE — Discharge Instructions (Addendum)
You received an injection of penicillin today in the ED.  Call and schedule follow-up appointment with infectious disease as soon as possible.  Follow-up with primary care provider as needed.  Return to the ED if you are experiencing increasing/worsening abdominal pain, fever, vomiting, or worsening symptoms.

## 2021-10-30 NOTE — ED Triage Notes (Signed)
Patient states he needs another shot for syphilis. Patient states "I need 2 more,"
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
## 2021-11-05 ENCOUNTER — Other Ambulatory Visit (HOSPITAL_COMMUNITY)
Admission: RE | Admit: 2021-11-05 | Discharge: 2021-11-05 | Disposition: A | Discharge status: Home / Self Care | Payer: Medicaid Other | Source: Ambulatory Visit | Attending: Infectious Diseases | Admitting: Infectious Diseases

## 2021-11-05 ENCOUNTER — Other Ambulatory Visit: Payer: Self-pay

## 2021-11-05 ENCOUNTER — Encounter: Payer: Self-pay | Admitting: Infectious Diseases

## 2021-11-05 ENCOUNTER — Ambulatory Visit (INDEPENDENT_AMBULATORY_CARE_PROVIDER_SITE_OTHER): Payer: Self-pay | Admitting: Infectious Diseases

## 2021-11-05 VITALS — BP 126/80 | Temp 98.2°F | Wt 145.0 lb

## 2021-11-05 DIAGNOSIS — Z23 Encounter for immunization: Secondary | ICD-10-CM

## 2021-11-05 DIAGNOSIS — A549 Gonococcal infection, unspecified: Secondary | ICD-10-CM | POA: Insufficient documentation

## 2021-11-05 DIAGNOSIS — Z1159 Encounter for screening for other viral diseases: Secondary | ICD-10-CM

## 2021-11-05 DIAGNOSIS — Z717 Human immunodeficiency virus [HIV] counseling: Secondary | ICD-10-CM | POA: Insufficient documentation

## 2021-11-05 DIAGNOSIS — A539 Syphilis, unspecified: Secondary | ICD-10-CM | POA: Diagnosis not present

## 2021-11-05 DIAGNOSIS — Z7185 Encounter for immunization safety counseling: Secondary | ICD-10-CM

## 2021-11-05 DIAGNOSIS — Z9189 Other specified personal risk factors, not elsewhere classified: Secondary | ICD-10-CM

## 2021-11-05 MED ORDER — PENICILLIN G BENZATHINE 1200000 UNIT/2ML IM SUSY
2.4000 10*6.[IU] | PREFILLED_SYRINGE | Freq: Once | INTRAMUSCULAR | Status: AC
  Administered 2021-11-05: 15:00:00 2.4 10*6.[IU] via INTRAMUSCULAR

## 2021-11-05 NOTE — Addendum Note (Signed)
Addended by: Valarie Cones on: 11/05/2021 03:01 PM   Modules accepted: Orders
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
## 2021-11-06 LAB — CYTOLOGY, (ORAL, ANAL, URETHRAL) ANCILLARY ONLY
Chlamydia: NEGATIVE
Chlamydia: NEGATIVE
Comment: NEGATIVE
Comment: NEGATIVE
Comment: NORMAL
Comment: NORMAL
Neisseria Gonorrhea: NEGATIVE
Neisseria Gonorrhea: POSITIVE — AB

## 2021-11-06 LAB — COMPREHENSIVE METABOLIC PANEL
AG Ratio: 2 (calc) (ref 1.0–2.5)
ALT: 15 U/L (ref 9–46)
AST: 19 U/L (ref 10–40)
Albumin: 4.6 g/dL (ref 3.6–5.1)
Alkaline phosphatase (APISO): 65 U/L (ref 36–130)
BUN: 10 mg/dL (ref 7–25)
CO2: 29 mmol/L (ref 20–32)
Calcium: 9.7 mg/dL (ref 8.6–10.3)
Chloride: 104 mmol/L (ref 98–110)
Creat: 0.83 mg/dL (ref 0.60–1.24)
Globulin: 2.3 g/dL (calc) (ref 1.9–3.7)
Glucose, Bld: 89 mg/dL (ref 65–99)
Potassium: 4.2 mmol/L (ref 3.5–5.3)
Sodium: 141 mmol/L (ref 135–146)
Total Bilirubin: 1.2 mg/dL (ref 0.2–1.2)
Total Protein: 6.9 g/dL (ref 6.1–8.1)

## 2021-11-06 LAB — HEPATITIS C ANTIBODY
Hepatitis C Ab: NONREACTIVE
SIGNAL TO CUT-OFF: 0.06 (ref <1.00)

## 2021-11-06 LAB — HEPATITIS B SURFACE ANTIBODY,QUALITATIVE: Hep B S Ab: NONREACTIVE

## 2021-11-06 LAB — HEPATITIS B SURFACE ANTIGEN: Hepatitis B Surface Ag: NONREACTIVE

## 2021-11-06 LAB — HEPATITIS B CORE ANTIBODY, TOTAL: Hep B Core Total Ab: NONREACTIVE

## 2021-11-10 ENCOUNTER — Telehealth: Payer: Self-pay

## 2021-11-10 NOTE — Telephone Encounter (Signed)
-----   Message from Odette Fraction, MD sent at 11/10/2021  7:58 AM EST ----- Patient is positive for Gonorrhea in the pharynx. Needs ceftriaxone 500mg  one dose. His sex partner should also be tested and treated. No unprotected sexual activities for 7 days after treatment

## 2021-11-10 NOTE — Telephone Encounter (Signed)
Spoke with patient, relayed positive gonorrhea results and need for treatment. Advised patient no sex until treatment is completed plus an additional 7 days and instructed to notify sexual partners for testing and treatment. Patient verbalized understanding and has no further questions.    Accepts appointment for 11/12/21.   Sandie Ano, RN

## 2021-11-10 NOTE — Progress Notes (Signed)
Patient is positive for Gonorrhea in the pharynx. Needs ceftriaxone 500mg  one dose. His sex partner should also be tested and treated. No unprotected sexual activities for 7 days after treatment

## 2021-11-12 ENCOUNTER — Ambulatory Visit: Payer: Medicaid Other

## 2021-11-12 DIAGNOSIS — Z23 Encounter for immunization: Secondary | ICD-10-CM | POA: Insufficient documentation

## 2021-12-04 ENCOUNTER — Other Ambulatory Visit (HOSPITAL_COMMUNITY): Payer: Self-pay

## 2021-12-04 ENCOUNTER — Other Ambulatory Visit: Payer: Self-pay

## 2021-12-04 ENCOUNTER — Ambulatory Visit (INDEPENDENT_AMBULATORY_CARE_PROVIDER_SITE_OTHER): Payer: Self-pay | Admitting: Pharmacist

## 2021-12-04 DIAGNOSIS — A549 Gonococcal infection, unspecified: Secondary | ICD-10-CM

## 2021-12-04 MED ORDER — CEFTRIAXONE SODIUM 500 MG IJ SOLR
500.0000 mg | Freq: Once | INTRAMUSCULAR | Status: AC
  Administered 2021-12-04: 500 mg via INTRAMUSCULAR
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
## 2021-12-31 ENCOUNTER — Ambulatory Visit (INDEPENDENT_AMBULATORY_CARE_PROVIDER_SITE_OTHER): Payer: Self-pay | Admitting: Pharmacist

## 2021-12-31 ENCOUNTER — Other Ambulatory Visit: Payer: Self-pay

## 2021-12-31 DIAGNOSIS — A549 Gonococcal infection, unspecified: Secondary | ICD-10-CM
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
## 2022-01-05 LAB — GC/CHLAMYDIA PROBE, AMP (THROAT)
Chlamydia trachomatis RNA: NOT DETECTED
Neisseria gonorrhoeae RNA: NOT DETECTED

## 2022-05-28 ENCOUNTER — Other Ambulatory Visit: Payer: Self-pay

## 2022-05-28 ENCOUNTER — Emergency Department (HOSPITAL_COMMUNITY)
Admission: EM | Admit: 2022-05-28 | Discharge: 2022-05-28 | Disposition: A | Discharge status: Home / Self Care | Payer: Medicaid Other | Attending: Emergency Medicine | Admitting: Emergency Medicine

## 2022-05-28 ENCOUNTER — Encounter (HOSPITAL_COMMUNITY): Payer: Self-pay | Admitting: Emergency Medicine

## 2022-05-28 DIAGNOSIS — Z113 Encounter for screening for infections with a predominantly sexual mode of transmission: Secondary | ICD-10-CM | POA: Insufficient documentation

## 2022-05-28 DIAGNOSIS — Z711 Person with feared health complaint in whom no diagnosis is made: Secondary | ICD-10-CM

## 2022-05-28 LAB — URINALYSIS, COMPLETE (UACMP) WITH MICROSCOPIC
Bacteria, UA: NONE SEEN
Bilirubin Urine: NEGATIVE
Glucose, UA: NEGATIVE mg/dL
Hgb urine dipstick: NEGATIVE
Ketones, ur: NEGATIVE mg/dL
Nitrite: NEGATIVE
Protein, ur: NEGATIVE mg/dL
Specific Gravity, Urine: 1.021 (ref 1.005–1.030)
pH: 5 (ref 5.0–8.0)

## 2022-05-28 NOTE — ED Provider Triage Note (Signed)
Emergency Medicine Provider Triage Evaluation Note  Jeremiah Christian , a 26 y.o. male  was evaluated in triage.  Pt complains of itchy rash in his groin.  He thinks that he may have gonorrhea.  He recently had contact with someone who he thinks may have had gonorrhea.    Physical Exam  BP 95/69 (BP Location: Right Arm)   Pulse 92   Temp 98.5 F (36.9 C) (Oral)   Resp 16   SpO2 94%  Gen:   Awake, no distress   Resp:  Normal effort  MSK:   Moves extremities without difficulty  Other:  GU exam deferred.   Medical Decision Making  Medically screening exam initiated at 7:47 PM.  Appropriate orders placed.  Jeremiah Christian was informed that the remainder of the evaluation will be completed by another provider, this initial triage assessment does not replace that evaluation, and the importance of remaining in the ED until their evaluation is complete.     Cristina Gong, [place] 05/28/22 1950

## 2022-05-28 NOTE — Discharge Instructions (Signed)
Return for any problem.  You should go to the [facility] Department for a more thorough and exhaustive evaluation for possible sexually transmitted disease.

## 2022-05-28 NOTE — ED Provider Notes (Signed)
[facility] EMERGENCY DEPARTMENT Provider Note   CSN: [id number] Arrival date & time: 05/28/22  1810     History  Chief Complaint  Patient presents with   STD screening    Jeremiah Christian is a 26 y.o. male.  26 year old male with prior medical history as detailed below presents for evaluation.  He is concerned about possible STD  He reports that he "feels a bump" on his perineum.  It is unclear how long he has had the symptoms.  He denies penile discharge or drainage.  He denies dysuria.  He denies fever.  He is otherwise without complaint.  The history is provided by the patient and medical records.       Home Medications Prior to Admission medications   Not on File      Allergies    Banana    Review of Systems   Review of Systems  All other systems reviewed and are negative.   Physical Exam Updated Vital Signs BP 95/69 (BP Location: Right Arm)   Pulse 92   Temp 98.5 F (36.9 C) (Oral)   Resp 16   SpO2 94%  Physical Exam Vitals and nursing note reviewed.  Constitutional:      General: He is not in acute distress.    Appearance: Normal appearance. He is well-developed.  HENT:     Head: Normocephalic and atraumatic.  Eyes:     Conjunctiva/sclera: Conjunctivae normal.     Pupils: Pupils are equal, round, and reactive to light.  Cardiovascular:     Rate and Rhythm: Normal rate and regular rhythm.     Heart sounds: Normal heart sounds.  Pulmonary:     Effort: Pulmonary effort is normal. No respiratory distress.     Breath sounds: Normal breath sounds.  Abdominal:     General: There is no distension.     Palpations: Abdomen is soft.     Tenderness: There is no abdominal tenderness.  Genitourinary:    Penis: Normal.      Testes: Normal.     Prostate: Normal.  Musculoskeletal:        General: No deformity. Normal range of motion.     Cervical back: Normal range of motion and neck supple.  Skin:    General: Skin is warm and  dry.  Neurological:     General: No focal deficit present.     Mental Status: He is alert and oriented to person, place, and time.     ED Results / Procedures / Treatments   Labs (all labs ordered are listed, but only abnormal results are displayed) Labs Reviewed  URINALYSIS, COMPLETE (UACMP) WITH MICROSCOPIC - Abnormal; Notable for the following components:      Result Value   Leukocytes,Ua SMALL (*)    All other components within normal limits  GC/CHLAMYDIA PROBE AMP ([place]) NOT AT [facility]    EKG None  Radiology No results found.  Procedures Procedures    Medications Ordered in ED Medications - No data to display  ED Course/ Medical Decision Making/ A&P                           Medical Decision Making   Medical Screen Complete  This patient presented to the ED with complaint of concern for STD.  This complaint involves an extensive number of treatment options. The initial differential diagnosis includes, but is not limited to, STD,etc   This presentation is:  Acute, Self-Limited, Previously Undiagnosed, and Uncertain Prognosis  Patient presents with concern for possible STD.  Patient without symptoms suggestive of actual STD.  Screening labs obtained.  Patient is reassured.  He is advised that he should follow-up closely with health department for extensive and thorough work-up for possible STD.  Importance of close follow-up is stressed.  Strict return precautions given and understood.   Additional history obtained:  External records from outside sources obtained and reviewed including prior ED visits and prior Inpatient records.    Lab Tests:  I ordered and personally interpreted labs.  The pertinent results include:  UA, GC/Chlamydia Problem List / ED Course:  Concern for STD   Reevaluation:  After the interventions noted above, I reevaluated the patient and found that they have: improved  Disposition:  After consideration of the  diagnostic results and the patients response to treatment, I feel that the patent would benefit from close outpatient follow up.           Final Clinical Impression(s) / ED Diagnoses Final diagnoses:  Concern about STD in male without diagnosis    Rx / DC Orders ED Discharge Orders     None         Wynetta Fines, MD 05/28/22 2210

## 2022-05-28 NOTE — ED Triage Notes (Signed)
Patient requesting STD test reports itchy rash at groin/anus this week , denies dysuria or penile discharge .

## 2022-09-09 ENCOUNTER — Emergency Department (HOSPITAL_COMMUNITY)
Admission: EM | Admit: 2022-09-09 | Discharge: 2022-09-10 | Payer: Medicaid Other | Attending: Emergency Medicine | Admitting: Emergency Medicine

## 2022-09-09 ENCOUNTER — Other Ambulatory Visit: Payer: Self-pay

## 2022-09-09 ENCOUNTER — Encounter (HOSPITAL_COMMUNITY): Payer: Self-pay | Admitting: Emergency Medicine

## 2022-09-09 DIAGNOSIS — F10120 Alcohol abuse with intoxication, uncomplicated: Secondary | ICD-10-CM | POA: Insufficient documentation

## 2022-09-09 DIAGNOSIS — F10920 Alcohol use, unspecified with intoxication, uncomplicated: Secondary | ICD-10-CM

## 2022-09-09 DIAGNOSIS — Z0279 Encounter for issue of other medical certificate: Secondary | ICD-10-CM | POA: Insufficient documentation

## 2022-09-09 NOTE — ED Triage Notes (Signed)
Pt comes from the jail for medical clearance after vomiting. Pt was taken to jail with etoh on board and vomited x 1, jail RN stated that they could not accept the pt.

## 2022-09-10 NOTE — ED Provider Notes (Signed)
[place] DEPT Provider Note   CSN: [id number] Arrival date & time: 09/09/22  2322     History  Chief Complaint  Patient presents with   Medical Clearance    Jeremiah Christian is a 26 y.o. male.  HPI   Patient without significant medical history presents in police custody.  Patient states that he has no complaints at this time, states that he drank 3    4 -Loc[employer]'s and got intoxicated, he denies suicidal/homicidal ideations denies any hallucinations or delusions, he does not endorse any chest pain abdominal pain denies neck or back pain denies any recent falls he has no other complaints.  Police at bedside evaluated the story, states that he was called out to a residential home for agitation, patient was intoxicated and was throwing things in the house when [facility] he had 1 episode of emesis and was brought here for further evaluation.  Home Medications Prior to Admission medications   Not on File      Allergies    Banana    Review of Systems   Review of Systems  Constitutional:  Negative for chills and fever.  Respiratory:  Negative for shortness of breath.   Cardiovascular:  Negative for chest pain.  Gastrointestinal:  Negative for abdominal pain.  Neurological:  Negative for headaches.    Physical Exam Updated Vital Signs BP 124/78   Pulse 77   Temp 97.7 F (36.5 C) (Oral)   Resp 18   Ht 5\' 6"  (1.676 m)   Wt 66 kg   SpO2 99%   BMI 23.48 kg/m  Physical Exam Vitals and nursing note reviewed.  Constitutional:      General: He is not in acute distress.    Appearance: He is not ill-appearing.  HENT:     Head: Normocephalic and atraumatic.     Comments: There is no deformity of the head present no raccoon eyes or battle sign noted.    Nose: No congestion.     Mouth/Throat:     Mouth: Mucous membranes are moist.     Pharynx: Oropharynx is clear.     Comments: No trismus no torticollis no oral  trauma Eyes:     Extraocular Movements: Extraocular movements intact.     Conjunctiva/sclera: Conjunctivae normal.     Pupils: Pupils are equal, round, and reactive to light.  Cardiovascular:     Rate and Rhythm: Normal rate and regular rhythm.     Pulses: Normal pulses.     Heart sounds: No murmur heard.    No friction rub. No gallop.  Pulmonary:     Effort: Pulmonary effort is normal.  Abdominal:     Palpations: Abdomen is soft.     Tenderness: There is no abdominal tenderness. There is no guarding.  Skin:    General: Skin is warm and dry.  Neurological:     Mental Status: He is alert.     Comments: Patient was intoxicated but was responding appropriately, he is alert and orient x4, denies any suicidal homicidal ideations denies any hallucinations or delusions does not appear to be responding to internal stimuli.  There is no facial asymmetry no unilateral weakness present during my examination.  Psychiatric:        Mood and Affect: Mood normal.     ED Results / Procedures / Treatments   Labs (all labs ordered are listed, but only abnormal results are displayed) Labs Reviewed - No data  to display  EKG None  Radiology No results found.  Procedures Procedures    Medications Ordered in ED Medications - No data to display  ED Course/ Medical Decision Making/ A&P                           Medical Decision Making  This patient presents to the ED for concern of vomiting, this involves an extensive number of treatment options, and is a complaint that carries with it a high risk of complications and morbidity.  The differential diagnosis includes psychiatric emergency, metabolic derailments, withdraws    Additional history obtained:  Additional history obtained from law enforcement External records from outside source obtained and reviewed including recent ER note   Co morbidities that complicate the patient evaluation  N/A  Social Determinants of  Health:  N/A    Lab Tests:  I Ordered, and personally interpreted labs.  The pertinent results include: N/A   Imaging Studies ordered:  I ordered imaging studies including n/a I independently visualized and interpreted imaging which showed n/a I agree with the radiologist interpretation   Cardiac Monitoring:  The patient was maintained on a cardiac monitor.  I personally viewed and interpreted the cardiac monitored which showed an underlying rhythm of: n/a   Medicines ordered and prescription drug management:  I ordered medication including n/a I have reviewed the patients home medicines and have made adjustments as needed  Critical Interventions:  N/a   Reevaluation:  Presents after 1 episode of vomiting, he was intoxicated on arrival, patient inserted for 3 and half hours, has not vomited since, he is having no complaints, he is ready for discharge.  Consultations Obtained:  N/a    Test Considered:  N/a    Rule out I doubt withdrawals nontremulous on my exam vital signs are reassuring.  I doubt psychiatric emergency does not endorse any suicidal homicidal ideations does not appear to be responding to internal stimuli.  I doubt ruptured stomach ulcer abdomen soft nontender no hematemesis or coffee-ground emesis he has a nonsurgical abdomen.   Dispostion and problem list  After consideration of the diagnostic results and the patients response to treatment, I feel that the patent would benefit from discharge.  Intoxication-patient will be discharged and be further observed by law enforcement.            Final Clinical Impression(s) / ED Diagnoses Final diagnoses:  Alcoholic intoxication without complication [facility])    Rx / DC Orders ED Discharge Orders     None         Carroll Sage, PA-C 09/10/22 3825    Zadie Rhine, MD 09/10/22 [phone]

## 2022-09-10 NOTE — Discharge Instructions (Signed)
I have given you outpatient resources for substance dependency.  Follow-up with your PCP as needed  Come back to the emergency department if you develop chest pain, shortness of breath, severe abdominal pain, uncontrolled nausea, vomiting, diarrhea.
